# Patient Record
Sex: Female | Born: 1985 | Race: Black or African American | Hispanic: No | Smoking: Current every day smoker
Health system: Southern US, Community
[De-identification: ages and names within clinical notes are randomized; demographics above are authoritative.]

## PROBLEM LIST (undated history)

## (undated) DIAGNOSIS — N760 Acute vaginitis: Secondary | ICD-10-CM

## (undated) DIAGNOSIS — B9689 Other specified bacterial agents as the cause of diseases classified elsewhere: Secondary | ICD-10-CM

## (undated) DIAGNOSIS — Z113 Encounter for screening for infections with a predominantly sexual mode of transmission: Principal | ICD-10-CM

## (undated) DIAGNOSIS — K859 Acute pancreatitis without necrosis or infection, unspecified: Secondary | ICD-10-CM

## (undated) DIAGNOSIS — O159 Eclampsia, unspecified as to time period: Secondary | ICD-10-CM

## (undated) HISTORY — PX: NO PAST SURGERIES: SHX2092

## (undated) HISTORY — DX: Encounter for screening for infections with a predominantly sexual mode of transmission: Z11.3

## (undated) HISTORY — DX: Other specified bacterial agents as the cause of diseases classified elsewhere: N76.0

## (undated) HISTORY — DX: Other specified bacterial agents as the cause of diseases classified elsewhere: B96.89

---

## 2007-12-09 ENCOUNTER — Other Ambulatory Visit: Admission: RE | Admit: 2007-12-09 | Discharge: 2007-12-09 | Payer: Self-pay | Admitting: Obstetrics and Gynecology

## 2009-01-04 ENCOUNTER — Emergency Department (HOSPITAL_COMMUNITY): Admission: EM | Admit: 2009-01-04 | Discharge: 2009-01-05 | Payer: Self-pay | Admitting: Emergency Medicine

## 2009-01-07 ENCOUNTER — Emergency Department (HOSPITAL_COMMUNITY): Admission: EM | Admit: 2009-01-07 | Discharge: 2009-01-07 | Payer: Self-pay | Admitting: Emergency Medicine

## 2009-02-13 ENCOUNTER — Other Ambulatory Visit: Admission: RE | Admit: 2009-02-13 | Discharge: 2009-02-13 | Payer: Self-pay | Admitting: Obstetrics and Gynecology

## 2009-07-02 ENCOUNTER — Other Ambulatory Visit: Payer: Self-pay | Admitting: Emergency Medicine

## 2009-07-02 ENCOUNTER — Inpatient Hospital Stay (HOSPITAL_COMMUNITY): Admission: AD | Admit: 2009-07-02 | Discharge: 2009-07-06 | Payer: Self-pay | Admitting: Family Medicine

## 2009-07-02 ENCOUNTER — Other Ambulatory Visit: Payer: Self-pay | Admitting: Obstetrics and Gynecology

## 2009-07-02 ENCOUNTER — Ambulatory Visit: Payer: Self-pay | Admitting: Family Medicine

## 2009-07-03 ENCOUNTER — Encounter: Payer: Self-pay | Admitting: Family Medicine

## 2009-07-07 ENCOUNTER — Inpatient Hospital Stay (HOSPITAL_COMMUNITY): Admission: AD | Admit: 2009-07-07 | Discharge: 2009-07-12 | Payer: Self-pay | Admitting: Obstetrics and Gynecology

## 2009-07-07 ENCOUNTER — Ambulatory Visit: Payer: Self-pay | Admitting: Family Medicine

## 2009-07-07 ENCOUNTER — Encounter: Payer: Self-pay | Admitting: Emergency Medicine

## 2010-07-03 ENCOUNTER — Inpatient Hospital Stay (HOSPITAL_COMMUNITY)
Admission: EM | Admit: 2010-07-03 | Discharge: 2010-07-04 | Payer: Self-pay | Source: Home / Self Care | Admitting: Emergency Medicine

## 2010-07-25 ENCOUNTER — Other Ambulatory Visit: Admission: RE | Admit: 2010-07-25 | Discharge: 2010-07-25 | Payer: Self-pay | Admitting: Obstetrics and Gynecology

## 2010-12-12 LAB — DIFFERENTIAL
Basophils Absolute: 0 10*3/uL (ref 0.0–0.1)
Basophils Absolute: 0 10*3/uL (ref 0.0–0.1)
Basophils Relative: 0 % (ref 0–1)
Basophils Relative: 0 % (ref 0–1)
Eosinophils Absolute: 0 10*3/uL (ref 0.0–0.7)
Eosinophils Absolute: 0.1 10*3/uL (ref 0.0–0.7)
Eosinophils Relative: 0 % (ref 0–5)
Eosinophils Relative: 1 % (ref 0–5)
Lymphocytes Relative: 4 % — ABNORMAL LOW (ref 12–46)
Lymphocytes Relative: 5 % — ABNORMAL LOW (ref 12–46)
Lymphs Abs: 0.8 10*3/uL (ref 0.7–4.0)
Lymphs Abs: 0.9 10*3/uL (ref 0.7–4.0)
Monocytes Absolute: 0.2 10*3/uL (ref 0.1–1.0)
Monocytes Absolute: 0.3 10*3/uL (ref 0.1–1.0)
Monocytes Relative: 1 % — ABNORMAL LOW (ref 3–12)
Monocytes Relative: 2 % — ABNORMAL LOW (ref 3–12)
Neutro Abs: 15.3 10*3/uL — ABNORMAL HIGH (ref 1.7–7.7)
Neutro Abs: 18.1 10*3/uL — ABNORMAL HIGH (ref 1.7–7.7)
Neutrophils Relative %: 92 % — ABNORMAL HIGH (ref 43–77)
Neutrophils Relative %: 95 % — ABNORMAL HIGH (ref 43–77)

## 2010-12-12 LAB — CBC
HCT: 38.5 % (ref 36.0–46.0)
HCT: 38.5 % (ref 36.0–46.0)
Hemoglobin: 12.6 g/dL (ref 12.0–15.0)
Hemoglobin: 12.7 g/dL (ref 12.0–15.0)
MCH: 27.4 pg (ref 26.0–34.0)
MCH: 27.4 pg (ref 26.0–34.0)
MCHC: 32.7 g/dL (ref 30.0–36.0)
MCHC: 32.9 g/dL (ref 30.0–36.0)
MCV: 83 fL (ref 78.0–100.0)
MCV: 83.8 fL (ref 78.0–100.0)
Platelets: 203 10*3/uL (ref 150–400)
Platelets: 230 10*3/uL (ref 150–400)
RBC: 4.6 MIL/uL (ref 3.87–5.11)
RBC: 4.64 MIL/uL (ref 3.87–5.11)
RDW: 15 % (ref 11.5–15.5)
RDW: 15.9 % — ABNORMAL HIGH (ref 11.5–15.5)
WBC: 16.6 10*3/uL — ABNORMAL HIGH (ref 4.0–10.5)
WBC: 19.1 10*3/uL — ABNORMAL HIGH (ref 4.0–10.5)

## 2010-12-12 LAB — D-DIMER, QUANTITATIVE
D-Dimer, Quant: 0.26 ug/mL-FEU (ref 0.00–0.48)
D-Dimer, Quant: 0.35 ug/mL-FEU (ref 0.00–0.48)

## 2010-12-12 LAB — GLUCOSE, CAPILLARY
Glucose-Capillary: 142 mg/dL — ABNORMAL HIGH (ref 70–99)
Glucose-Capillary: 151 mg/dL — ABNORMAL HIGH (ref 70–99)
Glucose-Capillary: 158 mg/dL — ABNORMAL HIGH (ref 70–99)
Glucose-Capillary: 170 mg/dL — ABNORMAL HIGH (ref 70–99)

## 2010-12-12 LAB — BASIC METABOLIC PANEL
BUN: 10 mg/dL (ref 6–23)
BUN: 9 mg/dL (ref 6–23)
CO2: 19 mEq/L (ref 19–32)
CO2: 22 mEq/L (ref 19–32)
Calcium: 8.8 mg/dL (ref 8.4–10.5)
Calcium: 9.1 mg/dL (ref 8.4–10.5)
Chloride: 108 mEq/L (ref 96–112)
Chloride: 110 mEq/L (ref 96–112)
Creatinine, Ser: 0.75 mg/dL (ref 0.4–1.2)
Creatinine, Ser: 0.84 mg/dL (ref 0.4–1.2)
GFR calc Af Amer: >60 mL/min (ref >60)
GFR calc Af Amer: >60 mL/min (ref >60)
GFR calc non Af Amer: >60 mL/min (ref >60)
GFR calc non Af Amer: >60 mL/min (ref >60)
Glucose, Bld: 136 mg/dL — ABNORMAL HIGH (ref 70–99)
Glucose, Bld: 138 mg/dL — ABNORMAL HIGH (ref 70–99)
Potassium: 3.6 mEq/L (ref 3.5–5.1)
Potassium: 3.9 mEq/L (ref 3.5–5.1)
Sodium: 136 mEq/L (ref 135–145)
Sodium: 137 mEq/L (ref 135–145)

## 2010-12-12 LAB — URINALYSIS, ROUTINE W REFLEX MICROSCOPIC
Bilirubin Urine: NEGATIVE
Glucose, UA: 100 mg/dL — AB
Ketones, ur: NEGATIVE mg/dL
Nitrite: NEGATIVE
Protein, ur: NEGATIVE mg/dL
Specific Gravity, Urine: 1.005 (ref 1.005–1.030)
Urobilinogen, UA: 0.2 mg/dL (ref 0.0–1.0)
pH: 6 (ref 5.0–8.0)

## 2010-12-12 LAB — HEMOGLOBIN A1C
Hgb A1c MFr Bld: 5.5 % (ref <5.7)
Mean Plasma Glucose: 111 mg/dL (ref <117)

## 2010-12-12 LAB — RAPID URINE DRUG SCREEN, HOSP PERFORMED
Amphetamines: NOT DETECTED
Barbiturates: NOT DETECTED
Benzodiazepines: NOT DETECTED
Cocaine: NOT DETECTED
Opiates: NOT DETECTED
Tetrahydrocannabinol: NOT DETECTED

## 2010-12-12 LAB — CK TOTAL AND CKMB (NOT AT ARMC)
CK, MB: 0.9 ng/mL (ref 0.3–4.0)
Relative Index: INVALID (ref 0.0–2.5)
Total CK: 66 U/L (ref 7–177)

## 2010-12-12 LAB — URINE MICROSCOPIC-ADD ON

## 2010-12-12 LAB — TROPONIN I: Troponin I: <0.01 ng/mL (ref 0.00–0.06)

## 2010-12-12 LAB — TSH: TSH: 0.557 u[IU]/mL (ref 0.350–4.500)

## 2010-12-12 LAB — RAPID STREP SCREEN (MED CTR MEBANE ONLY): Streptococcus, Group A Screen (Direct): NEGATIVE

## 2011-01-02 LAB — COMPREHENSIVE METABOLIC PANEL
ALT: 30 U/L (ref 0–35)
ALT: 35 U/L (ref 0–35)
ALT: 48 U/L — ABNORMAL HIGH (ref 0–35)
ALT: 49 U/L — ABNORMAL HIGH (ref 0–35)
ALT: 63 U/L — ABNORMAL HIGH (ref 0–35)
ALT: 77 U/L — ABNORMAL HIGH (ref 0–35)
AST: 139 U/L — ABNORMAL HIGH (ref 0–37)
AST: 26 U/L (ref 0–37)
AST: 38 U/L — ABNORMAL HIGH (ref 0–37)
AST: 45 U/L — ABNORMAL HIGH (ref 0–37)
AST: 56 U/L — ABNORMAL HIGH (ref 0–37)
AST: 89 U/L — ABNORMAL HIGH (ref 0–37)
Albumin: 2.3 g/dL — ABNORMAL LOW (ref 3.5–5.2)
Albumin: 2.3 g/dL — ABNORMAL LOW (ref 3.5–5.2)
Albumin: 2.3 g/dL — ABNORMAL LOW (ref 3.5–5.2)
Albumin: 2.3 g/dL — ABNORMAL LOW (ref 3.5–5.2)
Albumin: 2.5 g/dL — ABNORMAL LOW (ref 3.5–5.2)
Alkaline Phosphatase: 67 U/L (ref 39–117)
Alkaline Phosphatase: 67 U/L (ref 39–117)
Alkaline Phosphatase: 69 U/L (ref 39–117)
Alkaline Phosphatase: 72 U/L (ref 39–117)
Alkaline Phosphatase: 72 U/L (ref 39–117)
Alkaline Phosphatase: 75 U/L (ref 39–117)
Alkaline Phosphatase: 81 U/L (ref 39–117)
Alkaline Phosphatase: 89 U/L (ref 39–117)
BUN: 12 mg/dL (ref 6–23)
BUN: 13 mg/dL (ref 6–23)
BUN: 13 mg/dL (ref 6–23)
BUN: 17 mg/dL (ref 6–23)
BUN: 17 mg/dL (ref 6–23)
BUN: 9 mg/dL (ref 6–23)
CO2: 19 mEq/L (ref 19–32)
CO2: 21 mEq/L (ref 19–32)
CO2: 24 mEq/L (ref 19–32)
CO2: 25 mEq/L (ref 19–32)
Calcium: 6.6 mg/dL — ABNORMAL LOW (ref 8.4–10.5)
Calcium: 6.8 mg/dL — ABNORMAL LOW (ref 8.4–10.5)
Calcium: 7.1 mg/dL — ABNORMAL LOW (ref 8.4–10.5)
Calcium: 7.6 mg/dL — ABNORMAL LOW (ref 8.4–10.5)
Calcium: 8.3 mg/dL — ABNORMAL LOW (ref 8.4–10.5)
Calcium: 8.7 mg/dL (ref 8.4–10.5)
Chloride: 107 mEq/L (ref 96–112)
Chloride: 109 mEq/L (ref 96–112)
Chloride: 110 mEq/L (ref 96–112)
Creatinine, Ser: 0.72 mg/dL (ref 0.4–1.2)
Creatinine, Ser: 0.78 mg/dL (ref 0.4–1.2)
Creatinine, Ser: 0.78 mg/dL (ref 0.4–1.2)
Creatinine, Ser: 0.8 mg/dL (ref 0.4–1.2)
Creatinine, Ser: 0.82 mg/dL (ref 0.4–1.2)
Creatinine, Ser: 0.82 mg/dL (ref 0.4–1.2)
GFR calc Af Amer: >60 mL/min (ref >60)
GFR calc Af Amer: >60 mL/min (ref >60)
GFR calc Af Amer: >60 mL/min (ref >60)
GFR calc Af Amer: >60 mL/min (ref >60)
GFR calc Af Amer: >60 mL/min (ref >60)
GFR calc Af Amer: >60 mL/min (ref >60)
GFR calc non Af Amer: >60 mL/min (ref >60)
GFR calc non Af Amer: >60 mL/min (ref >60)
GFR calc non Af Amer: >60 mL/min (ref >60)
GFR calc non Af Amer: >60 mL/min (ref >60)
Glucose, Bld: 101 mg/dL — ABNORMAL HIGH (ref 70–99)
Glucose, Bld: 110 mg/dL — ABNORMAL HIGH (ref 70–99)
Glucose, Bld: 78 mg/dL (ref 70–99)
Glucose, Bld: 80 mg/dL (ref 70–99)
Glucose, Bld: 81 mg/dL (ref 70–99)
Glucose, Bld: 83 mg/dL (ref 70–99)
Glucose, Bld: 89 mg/dL (ref 70–99)
Potassium: 3.7 mEq/L (ref 3.5–5.1)
Potassium: 3.9 mEq/L (ref 3.5–5.1)
Potassium: 3.9 mEq/L (ref 3.5–5.1)
Potassium: 4.2 mEq/L (ref 3.5–5.1)
Potassium: 4.3 mEq/L (ref 3.5–5.1)
Potassium: 4.4 mEq/L (ref 3.5–5.1)
Potassium: 4.5 mEq/L (ref 3.5–5.1)
Sodium: 132 mEq/L — ABNORMAL LOW (ref 135–145)
Sodium: 136 mEq/L (ref 135–145)
Sodium: 137 mEq/L (ref 135–145)
Sodium: 137 mEq/L (ref 135–145)
Sodium: 137 mEq/L (ref 135–145)
Sodium: 138 mEq/L (ref 135–145)
Total Bilirubin: 0.6 mg/dL (ref 0.3–1.2)
Total Bilirubin: 0.7 mg/dL (ref 0.3–1.2)
Total Bilirubin: 1.2 mg/dL (ref 0.3–1.2)
Total Protein: 4.3 g/dL — ABNORMAL LOW (ref 6.0–8.3)
Total Protein: 4.4 g/dL — ABNORMAL LOW (ref 6.0–8.3)
Total Protein: 4.8 g/dL — ABNORMAL LOW (ref 6.0–8.3)
Total Protein: 4.8 g/dL — ABNORMAL LOW (ref 6.0–8.3)
Total Protein: 4.8 g/dL — ABNORMAL LOW (ref 6.0–8.3)
Total Protein: 4.8 g/dL — ABNORMAL LOW (ref 6.0–8.3)
Total Protein: 5.1 g/dL — ABNORMAL LOW (ref 6.0–8.3)
Total Protein: 5.2 g/dL — ABNORMAL LOW (ref 6.0–8.3)
Total Protein: 5.2 g/dL — ABNORMAL LOW (ref 6.0–8.3)
Total Protein: 5.4 g/dL — ABNORMAL LOW (ref 6.0–8.3)
Total Protein: 5.7 g/dL — ABNORMAL LOW (ref 6.0–8.3)

## 2011-01-02 LAB — HEPATIC FUNCTION PANEL
ALT: 25 U/L (ref 0–35)
AST: 45 U/L — ABNORMAL HIGH (ref 0–37)
Alkaline Phosphatase: 84 U/L (ref 39–117)
Bilirubin, Direct: <0.1 mg/dL (ref 0.0–0.3)
Total Protein: 6.1 g/dL (ref 6.0–8.3)

## 2011-01-02 LAB — DIFFERENTIAL
Basophils Absolute: 0 10*3/uL (ref 0.0–0.1)
Basophils Absolute: 0 10*3/uL (ref 0.0–0.1)
Basophils Absolute: 0 10*3/uL (ref 0.0–0.1)
Basophils Relative: 0 % (ref 0–1)
Basophils Relative: 0 % (ref 0–1)
Eosinophils Absolute: 0 10*3/uL (ref 0.0–0.7)
Eosinophils Relative: 0 % (ref 0–5)
Eosinophils Relative: 1 % (ref 0–5)
Lymphocytes Relative: 13 % (ref 12–46)
Lymphocytes Relative: 24 % (ref 12–46)
Lymphs Abs: 1.9 10*3/uL (ref 0.7–4.0)
Monocytes Absolute: 0.5 10*3/uL (ref 0.1–1.0)
Monocytes Relative: 6 % (ref 3–12)
Neutro Abs: 5.4 10*3/uL (ref 1.7–7.7)
Neutro Abs: 8.7 10*3/uL — ABNORMAL HIGH (ref 1.7–7.7)
Neutrophils Relative %: 80 % — ABNORMAL HIGH (ref 43–77)
Neutrophils Relative %: 82 % — ABNORMAL HIGH (ref 43–77)

## 2011-01-02 LAB — BASIC METABOLIC PANEL
Calcium: 9.2 mg/dL (ref 8.4–10.5)
GFR calc Af Amer: >60 mL/min (ref >60)
GFR calc non Af Amer: >60 mL/min (ref >60)
Glucose, Bld: 88 mg/dL (ref 70–99)
Potassium: 3.5 mEq/L (ref 3.5–5.1)
Sodium: 139 mEq/L (ref 135–145)

## 2011-01-02 LAB — CBC
HCT: 25.6 % — ABNORMAL LOW (ref 36.0–46.0)
HCT: 26.7 % — ABNORMAL LOW (ref 36.0–46.0)
HCT: 27.3 % — ABNORMAL LOW (ref 36.0–46.0)
HCT: 27.4 % — ABNORMAL LOW (ref 36.0–46.0)
HCT: 30.2 % — ABNORMAL LOW (ref 36.0–46.0)
HCT: 30.6 % — ABNORMAL LOW (ref 36.0–46.0)
HCT: 31.9 % — ABNORMAL LOW (ref 36.0–46.0)
HCT: 31.9 % — ABNORMAL LOW (ref 36.0–46.0)
HCT: 32.3 % — ABNORMAL LOW (ref 36.0–46.0)
HCT: 36.1 % (ref 36.0–46.0)
Hemoglobin: 10.3 g/dL — ABNORMAL LOW (ref 12.0–15.0)
Hemoglobin: 10.6 g/dL — ABNORMAL LOW (ref 12.0–15.0)
Hemoglobin: 10.7 g/dL — ABNORMAL LOW (ref 12.0–15.0)
Hemoglobin: 10.8 g/dL — ABNORMAL LOW (ref 12.0–15.0)
Hemoglobin: 10.9 g/dL — ABNORMAL LOW (ref 12.0–15.0)
Hemoglobin: 12.5 g/dL (ref 12.0–15.0)
Hemoglobin: 8.4 g/dL — ABNORMAL LOW (ref 12.0–15.0)
Hemoglobin: 9.1 g/dL — ABNORMAL LOW (ref 12.0–15.0)
Hemoglobin: 9.3 g/dL — ABNORMAL LOW (ref 12.0–15.0)
MCHC: 32.8 g/dL (ref 30.0–36.0)
MCHC: 33.1 g/dL (ref 30.0–36.0)
MCHC: 33.3 g/dL (ref 30.0–36.0)
MCHC: 33.5 g/dL (ref 30.0–36.0)
MCHC: 33.5 g/dL (ref 30.0–36.0)
MCHC: 33.5 g/dL (ref 30.0–36.0)
MCHC: 33.9 g/dL (ref 30.0–36.0)
MCHC: 34 g/dL (ref 30.0–36.0)
MCHC: 34.2 g/dL (ref 30.0–36.0)
MCV: 91.3 fL (ref 78.0–100.0)
MCV: 91.8 fL (ref 78.0–100.0)
MCV: 92 fL (ref 78.0–100.0)
MCV: 92.2 fL (ref 78.0–100.0)
MCV: 92.3 fL (ref 78.0–100.0)
MCV: 92.6 fL (ref 78.0–100.0)
MCV: 92.7 fL (ref 78.0–100.0)
MCV: 92.9 fL (ref 78.0–100.0)
MCV: 93.1 fL (ref 78.0–100.0)
Platelets: 110 10*3/uL — ABNORMAL LOW (ref 150–400)
Platelets: 113 10*3/uL — ABNORMAL LOW (ref 150–400)
Platelets: 114 10*3/uL — ABNORMAL LOW (ref 150–400)
Platelets: 115 10*3/uL — ABNORMAL LOW (ref 150–400)
Platelets: 64 10*3/uL — ABNORMAL LOW (ref 150–400)
Platelets: 72 10*3/uL — ABNORMAL LOW (ref 150–400)
Platelets: 76 10*3/uL — ABNORMAL LOW (ref 150–400)
RBC: 2.97 MIL/uL — ABNORMAL LOW (ref 3.87–5.11)
RBC: 3.04 MIL/uL — ABNORMAL LOW (ref 3.87–5.11)
RBC: 3.5 MIL/uL — ABNORMAL LOW (ref 3.87–5.11)
RBC: 3.78 MIL/uL — ABNORMAL LOW (ref 3.87–5.11)
RDW: 13.2 % (ref 11.5–15.5)
RDW: 13.3 % (ref 11.5–15.5)
RDW: 13.6 % (ref 11.5–15.5)
RDW: 13.7 % (ref 11.5–15.5)
RDW: 13.7 % (ref 11.5–15.5)
RDW: 14.2 % (ref 11.5–15.5)
RDW: 14.3 % (ref 11.5–15.5)
RDW: 14.5 % (ref 11.5–15.5)
RDW: 14.5 % (ref 11.5–15.5)
RDW: 14.6 % (ref 11.5–15.5)
RDW: 15.7 % — ABNORMAL HIGH (ref 11.5–15.5)
WBC: 10.9 10*3/uL — ABNORMAL HIGH (ref 4.0–10.5)
WBC: 11.6 10*3/uL — ABNORMAL HIGH (ref 4.0–10.5)
WBC: 7.6 10*3/uL (ref 4.0–10.5)
WBC: 7.7 10*3/uL (ref 4.0–10.5)
WBC: 7.7 10*3/uL (ref 4.0–10.5)
WBC: 8 10*3/uL (ref 4.0–10.5)
WBC: 8 10*3/uL (ref 4.0–10.5)

## 2011-01-02 LAB — PROTEIN, URINE, 24 HOUR
Collection Interval-UPROT: 24 hours
Protein, Urine: 65 mg/dL
Urine Total Volume-UPROT: 950 mL

## 2011-01-02 LAB — URINALYSIS, ROUTINE W REFLEX MICROSCOPIC
Bilirubin Urine: NEGATIVE
Glucose, UA: NEGATIVE mg/dL
Glucose, UA: NEGATIVE mg/dL
Ketones, ur: NEGATIVE mg/dL
Leukocytes, UA: NEGATIVE
Nitrite: NEGATIVE
Protein, ur: >300 mg/dL — AB
Specific Gravity, Urine: 1.025 (ref 1.005–1.030)
Specific Gravity, Urine: 1.025 (ref 1.005–1.030)
Urobilinogen, UA: 0.2 mg/dL (ref 0.0–1.0)
pH: 6.5 (ref 5.0–8.0)
pH: 7 (ref 5.0–8.0)

## 2011-01-02 LAB — MAGNESIUM
Magnesium: 5.3 mg/dL — ABNORMAL HIGH (ref 1.5–2.5)
Magnesium: 5.5 mg/dL — ABNORMAL HIGH (ref 1.5–2.5)
Magnesium: 5.7 mg/dL — ABNORMAL HIGH (ref 1.5–2.5)

## 2011-01-02 LAB — RAPID URINE DRUG SCREEN, HOSP PERFORMED
Barbiturates: NOT DETECTED
Benzodiazepines: NOT DETECTED
Cocaine: NOT DETECTED
Cocaine: NOT DETECTED
Opiates: NOT DETECTED
Tetrahydrocannabinol: NOT DETECTED
Tetrahydrocannabinol: POSITIVE — AB

## 2011-01-02 LAB — URINE MICROSCOPIC-ADD ON

## 2011-01-02 LAB — PROTIME-INR: INR: 0.92 (ref 0.00–1.49)

## 2011-01-02 LAB — CREATININE CLEARANCE, URINE, 24 HOUR
Creatinine Clearance: 128 mL/min — ABNORMAL HIGH (ref 75–115)
Creatinine, 24H Ur: 1324 mg/d (ref 700–1800)
Creatinine: 0.72 mg/dL (ref 0.40–1.20)
Urine Total Volume-CRCL: 950 mL

## 2011-01-02 LAB — RETICULOCYTES
RBC.: 3.38 MIL/uL — ABNORMAL LOW (ref 3.87–5.11)
Retic Ct Pct: 2.4 % (ref 0.4–3.1)

## 2011-01-02 LAB — URINE CULTURE

## 2011-01-02 LAB — APTT: aPTT: 27 seconds (ref 24–37)

## 2011-01-08 LAB — WET PREP, GENITAL

## 2011-01-08 LAB — GC/CHLAMYDIA PROBE AMP, GENITAL
Chlamydia, DNA Probe: NEGATIVE
GC Probe Amp, Genital: NEGATIVE

## 2011-01-08 LAB — URINALYSIS, ROUTINE W REFLEX MICROSCOPIC
Ketones, ur: NEGATIVE mg/dL
Nitrite: NEGATIVE
pH: 7 (ref 5.0–8.0)

## 2011-01-08 LAB — URINE MICROSCOPIC-ADD ON

## 2011-01-08 LAB — PREGNANCY, URINE: Preg Test, Ur: POSITIVE

## 2011-09-30 NOTE — L&D Delivery Note (Signed)
I was present for this delivery and agree with the above resident's note.  Cord was clamped and cut immediately after delivery and infant taken to warmer for resuscitation.  NICU team paged and arrived quickly to room. LEFTWICH-KIRBY, Idil Maslanka Certified Nurse-Midwife

## 2011-09-30 NOTE — L&D Delivery Note (Signed)
Delivery Note At 3:13 PM a viable female was delivered via Vaginal, Spontaneous Delivery (Presentation: Right Occiput Anterior).  APGAR: 3, 5; weight 7 lb 5.1 oz (3320 g).   Placenta status: Intact, Spontaneous.  Cord: 3 vessels with the following complications: None.  Cord pH: 7.12, 7.05.   Anesthesia: None  Episiotomy: None Lacerations: None Suture Repair: n/a Est. Blood Loss (mL): 200cc  Mom to postpartum.  Baby to NICU for respiratory distress, decreased tone.  Holly Joseph 06/28/2012, 4:01 PM

## 2011-12-18 ENCOUNTER — Other Ambulatory Visit: Payer: Self-pay | Admitting: Obstetrics & Gynecology

## 2011-12-18 ENCOUNTER — Other Ambulatory Visit (HOSPITAL_COMMUNITY)
Admission: RE | Admit: 2011-12-18 | Discharge: 2011-12-18 | Disposition: A | Discharge status: Home / Self Care | Payer: Self-pay | Source: Ambulatory Visit | Attending: Obstetrics & Gynecology | Admitting: Obstetrics & Gynecology

## 2011-12-18 DIAGNOSIS — Z113 Encounter for screening for infections with a predominantly sexual mode of transmission: Secondary | ICD-10-CM | POA: Insufficient documentation

## 2011-12-18 DIAGNOSIS — Z01419 Encounter for gynecological examination (general) (routine) without abnormal findings: Secondary | ICD-10-CM | POA: Insufficient documentation

## 2012-06-28 ENCOUNTER — Encounter (HOSPITAL_COMMUNITY): Payer: Self-pay | Admitting: Anesthesiology

## 2012-06-28 ENCOUNTER — Inpatient Hospital Stay (HOSPITAL_COMMUNITY): Payer: Medicaid Other | Admitting: Anesthesiology

## 2012-06-28 ENCOUNTER — Encounter (HOSPITAL_COMMUNITY)
Admission: AD | Disposition: A | Discharge status: Home / Self Care | Payer: Self-pay | Source: Ambulatory Visit | Attending: Obstetrics & Gynecology

## 2012-06-28 ENCOUNTER — Inpatient Hospital Stay (HOSPITAL_COMMUNITY)
Admission: AD | Admit: 2012-06-28 | Discharge: 2012-06-30 | DRG: 767 | Disposition: A | Discharge status: Home / Self Care | Payer: Medicaid Other | Source: Ambulatory Visit | Attending: Obstetrics & Gynecology | Admitting: Obstetrics & Gynecology

## 2012-06-28 ENCOUNTER — Encounter (HOSPITAL_COMMUNITY): Payer: Self-pay

## 2012-06-28 DIAGNOSIS — Z302 Encounter for sterilization: Secondary | ICD-10-CM

## 2012-06-28 DIAGNOSIS — Z3480 Encounter for supervision of other normal pregnancy, unspecified trimester: Secondary | ICD-10-CM

## 2012-06-28 DIAGNOSIS — O0940 Supervision of pregnancy with grand multiparity, unspecified trimester: Secondary | ICD-10-CM

## 2012-06-28 HISTORY — DX: Eclampsia, unspecified as to time period: O15.9

## 2012-06-28 HISTORY — PX: TUBAL LIGATION: SHX77

## 2012-06-28 LAB — CBC
HCT: 38.3 % (ref 36.0–46.0)
MCH: 28.4 pg (ref 26.0–34.0)
MCHC: 32.6 g/dL (ref 30.0–36.0)
MCV: 87 fL (ref 78.0–100.0)
Platelets: 176 10*3/uL (ref 150–400)
RDW: 15.2 % (ref 11.5–15.5)
WBC: 7.1 10*3/uL (ref 4.0–10.5)

## 2012-06-28 LAB — OB RESULTS CONSOLE ABO/RH: RH Type: POSITIVE

## 2012-06-28 LAB — TYPE AND SCREEN
ABO/RH(D): B POS
Antibody Screen: NEGATIVE

## 2012-06-28 LAB — OB RESULTS CONSOLE HIV ANTIBODY (ROUTINE TESTING): HIV: NONREACTIVE

## 2012-06-28 LAB — OB RESULTS CONSOLE ANTIBODY SCREEN: Antibody Screen: NEGATIVE

## 2012-06-28 LAB — ABO/RH: ABO/RH(D): B POS

## 2012-06-28 SURGERY — LIGATION, FALLOPIAN TUBE, POSTPARTUM
Anesthesia: Spinal | Site: Abdomen | Laterality: Bilateral | Wound class: Clean

## 2012-06-28 MED ORDER — FENTANYL CITRATE 0.05 MG/ML IJ SOLN
INTRAMUSCULAR | Status: AC
  Filled 2012-06-28: qty 2

## 2012-06-28 MED ORDER — BENZOCAINE-MENTHOL 20-0.5 % EX AERO: 1.0000 "application " | INHALATION_SPRAY | CUTANEOUS | Status: DC | PRN

## 2012-06-28 MED ORDER — ONDANSETRON HCL 4 MG PO TABS: 4.0000 mg | ORAL_TABLET | ORAL | Status: DC | PRN

## 2012-06-28 MED ORDER — PHENYLEPHRINE 40 MCG/ML (10ML) SYRINGE FOR IV PUSH (FOR BLOOD PRESSURE SUPPORT): 80.0000 ug | PREFILLED_SYRINGE | INTRAVENOUS | Status: DC | PRN

## 2012-06-28 MED ORDER — PHENYLEPHRINE 40 MCG/ML (10ML) SYRINGE FOR IV PUSH (FOR BLOOD PRESSURE SUPPORT)
80.0000 ug | PREFILLED_SYRINGE | INTRAVENOUS | Status: DC | PRN
  Filled 2012-06-28: qty 5

## 2012-06-28 MED ORDER — EPHEDRINE 5 MG/ML INJ: 10.0000 mg | INTRAVENOUS | Status: DC | PRN

## 2012-06-28 MED ORDER — SODIUM CHLORIDE 0.9 % IR SOLN
Status: DC | PRN
  Administered 2012-06-28: 1000 mL

## 2012-06-28 MED ORDER — CEFAZOLIN SODIUM-DEXTROSE 2-3 GM-% IV SOLR
2.0000 g | Freq: Once | INTRAVENOUS | Status: AC
  Administered 2012-06-28 (×2): 2 g via INTRAVENOUS
  Filled 2012-06-28: qty 50

## 2012-06-28 MED ORDER — MIDAZOLAM HCL 2 MG/2ML IJ SOLN
INTRAMUSCULAR | Status: AC
  Filled 2012-06-28: qty 2

## 2012-06-28 MED ORDER — CITRIC ACID-SODIUM CITRATE 334-500 MG/5ML PO SOLN
30.0000 mL | ORAL | Status: DC | PRN
  Administered 2012-06-28: 30 mL via ORAL
  Filled 2012-06-28: qty 15

## 2012-06-28 MED ORDER — LACTATED RINGERS IV SOLN
INTRAVENOUS | Status: DC | PRN
  Administered 2012-06-28: 17:00:00 via INTRAVENOUS

## 2012-06-28 MED ORDER — LACTATED RINGERS IV SOLN: 500.0000 mL | INTRAVENOUS | Status: DC | PRN

## 2012-06-28 MED ORDER — OXYCODONE-ACETAMINOPHEN 5-325 MG PO TABS: 1.0000 | ORAL_TABLET | ORAL | Status: DC | PRN

## 2012-06-28 MED ORDER — FENTANYL 2.5 MCG/ML BUPIVACAINE 1/10 % EPIDURAL INFUSION (WH - ANES)
14.0000 mL/h | INTRAMUSCULAR | Status: DC
  Filled 2012-06-28: qty 60

## 2012-06-28 MED ORDER — TETANUS-DIPHTH-ACELL PERTUSSIS 5-2.5-18.5 LF-MCG/0.5 IM SUSP
0.5000 mL | Freq: Once | INTRAMUSCULAR | Status: AC
  Administered 2012-06-29: 0.5 mL via INTRAMUSCULAR
  Filled 2012-06-28: qty 0.5

## 2012-06-28 MED ORDER — IBUPROFEN 600 MG PO TABS: 600.0000 mg | ORAL_TABLET | Freq: Four times a day (QID) | ORAL | Status: DC | PRN

## 2012-06-28 MED ORDER — FENTANYL CITRATE 0.05 MG/ML IJ SOLN
100.0000 ug | INTRAMUSCULAR | Status: DC | PRN
  Administered 2012-06-28: 100 ug via INTRAVENOUS
  Filled 2012-06-28: qty 2

## 2012-06-28 MED ORDER — FENTANYL CITRATE 0.05 MG/ML IJ SOLN
INTRAMUSCULAR | Status: DC | PRN
  Administered 2012-06-28: 100 ug via INTRAVENOUS

## 2012-06-28 MED ORDER — BUPIVACAINE HCL (PF) 0.5 % IJ SOLN
INTRAMUSCULAR | Status: AC
  Filled 2012-06-28: qty 30

## 2012-06-28 MED ORDER — ACETAMINOPHEN 325 MG PO TABS: 650.0000 mg | ORAL_TABLET | ORAL | Status: DC | PRN

## 2012-06-28 MED ORDER — ONDANSETRON HCL 4 MG/2ML IJ SOLN: 4.0000 mg | INTRAMUSCULAR | Status: DC | PRN

## 2012-06-28 MED ORDER — LANOLIN HYDROUS EX OINT: TOPICAL_OINTMENT | CUTANEOUS | Status: DC | PRN

## 2012-06-28 MED ORDER — KETOROLAC TROMETHAMINE 30 MG/ML IJ SOLN
INTRAMUSCULAR | Status: DC | PRN
  Administered 2012-06-28: 30 mg via INTRAVENOUS

## 2012-06-28 MED ORDER — SENNOSIDES-DOCUSATE SODIUM 8.6-50 MG PO TABS
2.0000 | ORAL_TABLET | Freq: Every day | ORAL | Status: DC
  Administered 2012-06-28 – 2012-06-29 (×2): 2 via ORAL

## 2012-06-28 MED ORDER — OXYTOCIN BOLUS FROM INFUSION
500.0000 mL | Freq: Once | INTRAVENOUS | Status: AC
  Administered 2012-06-28: 500 mL via INTRAVENOUS
  Filled 2012-06-28: qty 500

## 2012-06-28 MED ORDER — DIPHENHYDRAMINE HCL 50 MG/ML IJ SOLN: 12.5000 mg | INTRAMUSCULAR | Status: DC | PRN

## 2012-06-28 MED ORDER — LIDOCAINE HCL (PF) 1 % IJ SOLN
30.0000 mL | INTRAMUSCULAR | Status: DC | PRN
  Filled 2012-06-28: qty 30

## 2012-06-28 MED ORDER — ONDANSETRON HCL 4 MG/2ML IJ SOLN: 4.0000 mg | Freq: Four times a day (QID) | INTRAMUSCULAR | Status: DC | PRN

## 2012-06-28 MED ORDER — FENTANYL CITRATE 0.05 MG/ML IJ SOLN: 25.0000 ug | INTRAMUSCULAR | Status: DC | PRN

## 2012-06-28 MED ORDER — ZOLPIDEM TARTRATE 5 MG PO TABS: 5.0000 mg | ORAL_TABLET | Freq: Every evening | ORAL | Status: DC | PRN

## 2012-06-28 MED ORDER — SIMETHICONE 80 MG PO CHEW: 80.0000 mg | CHEWABLE_TABLET | ORAL | Status: DC | PRN

## 2012-06-28 MED ORDER — BUPIVACAINE HCL (PF) 0.5 % IJ SOLN
INTRAMUSCULAR | Status: DC | PRN
  Administered 2012-06-28: 20 mL

## 2012-06-28 MED ORDER — LACTATED RINGERS IV SOLN
INTRAVENOUS | Status: DC
  Administered 2012-06-28: 13:00:00 via INTRAVENOUS

## 2012-06-28 MED ORDER — LACTATED RINGERS IV SOLN: 500.0000 mL | Freq: Once | INTRAVENOUS | Status: DC

## 2012-06-28 MED ORDER — PRENATAL MULTIVITAMIN CH
1.0000 | ORAL_TABLET | Freq: Every day | ORAL | Status: DC
  Administered 2012-06-28 – 2012-06-30 (×3): 1 via ORAL
  Filled 2012-06-28 (×3): qty 1

## 2012-06-28 MED ORDER — WITCH HAZEL-GLYCERIN EX PADS: 1.0000 "application " | MEDICATED_PAD | CUTANEOUS | Status: DC | PRN

## 2012-06-28 MED ORDER — DIBUCAINE 1 % RE OINT: 1.0000 "application " | TOPICAL_OINTMENT | RECTAL | Status: DC | PRN

## 2012-06-28 MED ORDER — ONDANSETRON HCL 4 MG/2ML IJ SOLN
INTRAMUSCULAR | Status: AC
  Filled 2012-06-28: qty 2

## 2012-06-28 MED ORDER — DIPHENHYDRAMINE HCL 25 MG PO CAPS: 25.0000 mg | ORAL_CAPSULE | Freq: Four times a day (QID) | ORAL | Status: DC | PRN

## 2012-06-28 MED ORDER — OXYTOCIN 40 UNITS IN LACTATED RINGERS INFUSION - SIMPLE MED
62.5000 mL/h | Freq: Once | INTRAVENOUS | Status: AC
  Administered 2012-06-28: 62.5 mL/h via INTRAVENOUS
  Filled 2012-06-28: qty 1000

## 2012-06-28 MED ORDER — FAMOTIDINE 20 MG PO TABS
20.0000 mg | ORAL_TABLET | Freq: Once | ORAL | Status: AC
  Administered 2012-06-28: 20 mg via ORAL
  Filled 2012-06-28: qty 1

## 2012-06-28 MED ORDER — EPHEDRINE 5 MG/ML INJ
10.0000 mg | INTRAVENOUS | Status: DC | PRN
  Filled 2012-06-28: qty 4

## 2012-06-28 MED ORDER — ONDANSETRON HCL 4 MG/2ML IJ SOLN
INTRAMUSCULAR | Status: DC | PRN
  Administered 2012-06-28: 4 mg via INTRAVENOUS

## 2012-06-28 MED ORDER — IBUPROFEN 600 MG PO TABS
600.0000 mg | ORAL_TABLET | Freq: Four times a day (QID) | ORAL | Status: DC
  Administered 2012-06-28 – 2012-06-30 (×6): 600 mg via ORAL
  Filled 2012-06-28 (×6): qty 1

## 2012-06-28 MED ORDER — KETOROLAC TROMETHAMINE 30 MG/ML IJ SOLN
INTRAMUSCULAR | Status: AC
  Filled 2012-06-28: qty 1

## 2012-06-28 MED ORDER — MIDAZOLAM HCL 5 MG/5ML IJ SOLN
INTRAMUSCULAR | Status: DC | PRN
  Administered 2012-06-28: 2 mg via INTRAVENOUS

## 2012-06-28 SURGICAL SUPPLY — 25 items
BENZOIN TINCTURE PRP APPL 2/3 (GAUZE/BANDAGES/DRESSINGS) IMPLANT
CHLORAPREP W/TINT 26ML (MISCELLANEOUS) ×2 IMPLANT
CLIP FILSHIE TUBAL LIGA STRL (Clip) ×2 IMPLANT
CLOTH BEACON ORANGE TIMEOUT ST (SAFETY) ×2 IMPLANT
DRSG COVADERM PLUS 2X2 (GAUZE/BANDAGES/DRESSINGS) ×2 IMPLANT
ELECT REM PT RETURN 9FT ADLT (ELECTROSURGICAL) ×2
ELECTRODE REM PT RTRN 9FT ADLT (ELECTROSURGICAL) ×1 IMPLANT
GLOVE BIO SURGEON STRL SZ7 (GLOVE) ×2 IMPLANT
GLOVE BIOGEL PI IND STRL 7.0 (GLOVE) ×2 IMPLANT
GLOVE BIOGEL PI INDICATOR 7.0 (GLOVE) ×2
GOWN PREVENTION PLUS LG XLONG (DISPOSABLE) ×4 IMPLANT
NEEDLE HYPO 25X1 1.5 SAFETY (NEEDLE) ×2 IMPLANT
NS IRRIG 1000ML POUR BTL (IV SOLUTION) ×2 IMPLANT
PACK ABDOMINAL MINOR (CUSTOM PROCEDURE TRAY) ×2 IMPLANT
PENCIL BUTTON HOLSTER BLD 10FT (ELECTRODE) ×2 IMPLANT
SPONGE LAP 4X18 X RAY DECT (DISPOSABLE) ×2 IMPLANT
STRIP CLOSURE SKIN 1/2X4 (GAUZE/BANDAGES/DRESSINGS) IMPLANT
SUT CHROMIC 2 0 TIES 18 (SUTURE) IMPLANT
SUT VIC AB 0 CT1 27 (SUTURE) ×1
SUT VIC AB 0 CT1 27XBRD ANBCTR (SUTURE) ×1 IMPLANT
SUT VICRYL 4-0 PS2 18IN ABS (SUTURE) ×2 IMPLANT
SYR CONTROL 10ML LL (SYRINGE) ×2 IMPLANT
TOWEL OR 17X24 6PK STRL BLUE (TOWEL DISPOSABLE) ×4 IMPLANT
TRAY FOLEY CATH 14FR (SET/KITS/TRAYS/PACK) ×2 IMPLANT
WATER STERILE IRR 1000ML POUR (IV SOLUTION) IMPLANT

## 2012-06-28 NOTE — Anesthesia Preprocedure Evaluation (Deleted)
Anesthesia Evaluation  Patient identified by MRN, date of birth, ID band Patient awake    Reviewed: Allergy & Precautions, H&P , Patient's Chart, lab work & pertinent test results  Airway Mallampati: II  TM Distance: >3 FB Neck ROM: full    Dental  (+) Teeth Intact   Pulmonary    breath sounds clear to auscultation       Cardiovascular  Rhythm:regular Rate:Normal     Neuro/Psych    GI/Hepatic   Endo/Other  Morbid obesity  Renal/GU      Musculoskeletal   Abdominal   Peds  Hematology   Anesthesia Other Findings       Reproductive/Obstetrics (+) Pregnancy                            Anesthesia Physical Anesthesia Plan  ASA: II  Anesthesia Plan: Epidural   Post-op Pain Management:    Induction:   Airway Management Planned:   Additional Equipment:   Intra-op Plan:   Post-operative Plan:   Informed Consent: I have reviewed the patients History and Physical, chart, labs and discussed the procedure including the risks, benefits and alternatives for the proposed anesthesia with the patient or authorized representative who has indicated his/her understanding and acceptance.   Dental Advisory Given  Plan Discussed with:   Anesthesia Plan Comments: (Labs checked- platelets confirmed with RN in room. Fetal heart tracing, per RN, reported to be stable enough for sitting procedure. Discussed epidural, and patient consents to the procedure:  included risk of possible headache,backache, failed block, allergic reaction, and nerve injury. This patient was asked if she had any questions or concerns before the procedure started.)        Anesthesia Quick Evaluation  

## 2012-06-28 NOTE — Progress Notes (Signed)
Attending Progress Note  26 y.o. [id number] s/p SVD desires postpartum permanent sterilization.  Other reversible forms of contraception were discussed with patient; she declines all other modalities. Risks of procedure discussed with patient including but not limited to: risk of regret, permanence of method, bleeding, infection, injury to surrounding organs and need for additional procedures.  Failure risk of 0.5-1% with increased risk of ectopic gestation if pregnancy occurs was also discussed with patient.  Patient verbalized understanding of these risks and wants to proceed with sterilization.  Written informed consent obtained.  OR and Anesthesia notified. To OR when ready.  Jaynie Collins, MD, FACOG Attending Obstetrician & Gynecologist Faculty Practice, [facility]

## 2012-06-28 NOTE — Addendum Note (Signed)
Addendum  created 06/28/12 2102 by Orlie Pollen, CRNA   Modules edited:Anesthesia Flowsheet

## 2012-06-28 NOTE — H&P (Signed)
Holly Joseph is a 26 y.o. female presenting for SOOL.  Pt will contractions starting this morning. No LOF or bleeding. +FM. Rates contractions as increasing in intensity.  Has h/o eclampsia with 1st pregnancy (had seizure and was induced at 32 weeks- seizure was 1st indication of issue with BPs); pressures have been normal this pregnancy.   Maternal Medical History:  Reason for admission: Reason for admission: contractions.  Reason for Admission:   nauseaContractions: Onset was 3-5 hours ago.   Frequency: regular.   Perceived severity is moderate.    Fetal activity: Perceived fetal activity is normal.    Prenatal complications: No bleeding, hypertension, pre-eclampsia or preterm labor.   Prenatal Complications - Diabetes: none.    OB History    Grav Para Term Preterm Abortions TAB SAB Ect Mult Living   2 1  1      1      Past Medical History  Diagnosis Date  . Eclamptic seizure    Past Surgical History  Procedure Date  . No past surgeries    Family History: family history is negative for Other. Social History:  reports that she has been smoking Cigarettes.  She has been smoking about .25 packs per day. She has never used smokeless tobacco. She reports that she uses illicit drugs (Marijuana). She reports that she does not drink alcohol.   Prenatal Transfer Tool  Maternal Diabetes: No Genetic Screening: Normal Maternal Ultrasounds/Referrals: Normal; had marginal previa that resolved at 28 weeks Fetal Ultrasounds or other Referrals:  None Maternal Substance Abuse:  No Significant Maternal Medications:  None Significant Maternal Lab Results:  Lab values include: Group B Strep negative Other Comments:  None  Review of Systems  Constitutional: Negative for fever.  Cardiovascular: Negative for chest pain.  Gastrointestinal: Positive for abdominal pain. Negative for nausea and vomiting.  Neurological: Negative for dizziness and headaches.    Dilation: 7 Effacement (%):  90 Station: -1 Exam by:: Dr. Fara Boros Blood pressure 123/69, pulse 60, temperature 98.8 F (37.1 C), temperature source Oral, resp. rate 16, height 5\' 3"  (1.6 m), weight 95.8 kg (211 lb 3.2 oz), SpO2 100.00%. Maternal Exam:  Uterine Assessment: Contraction strength is moderate.  Contraction frequency is regular.   Abdomen: Patient reports no abdominal tenderness. Introitus: Normal vagina.  Ferning test: not done.  Amniotic fluid character: not assessed.  Pelvis: adequate for delivery.   Cervix: Cervix evaluated by digital exam.     Fetal Exam Fetal Monitor Review: Baseline rate: 130.  Variability: moderate (6-25 bpm).   Pattern: accelerations present and no decelerations.    Fetal State Assessment: Category I - tracings are normal.     Physical Exam  Constitutional: She is oriented to person, place, and time. She appears well-developed and well-nourished.  Cardiovascular: Normal rate and regular rhythm.   No murmur heard. Respiratory: Effort normal and breath sounds normal. No respiratory distress.  GI:       gravid  Musculoskeletal: She exhibits no edema.  Neurological: She is oriented to person, place, and time.  Skin: Skin is warm and dry.    Prenatal labs: ABO, Rh:  B+ Antibody:  neg Rubella:  imm RPR:   NR HBsAg:   neg HIV:   neg GBS: Negative (09/15 0000)   Assessment/Plan: 26 yo G2P0101 @ 39.5 weeks followed at [facility] presents with SOOL -Admit to L&D -GBS negative -Pt with h/o eclampsia with 1st pregnancy, will monitor BPs but have been normal during current pregnancy -Epidural upon request -  Category 1 fetal tracing   Breast BTL (papers signed 05/13/12 with FT)  Holly Joseph 06/28/2012, 12:42 PM

## 2012-06-28 NOTE — Progress Notes (Signed)
Holly Joseph is a 26 y.o. G2P0101 at [redacted]w[redacted]d admitted for active labor  Subjective: Waiting on labs to come back so she can have epidural placed; ctx are getting stronger  Objective: BP 120/65  Pulse 59  Temp 97.7 F (36.5 C) (Oral)  Resp 18  Ht 5\' 3"  (1.6 m)  Wt 95.8 kg (211 lb 3.2 oz)  BMI 37.41 kg/m2  SpO2 100%      FHT:  FHR: 120 bpm, variability: moderate,  accelerations:  Present,  decelerations:  Present early's UC:   regular, every 2 minutes SVE:   Dilation: 7 Effacement (%): 90 Station: -1 Exam by:: Dr. Fara Boros  Labs: Lab Results  Component Value Date   WBC 7.1 06/28/2012   HGB 12.5 06/28/2012   HCT 38.3 06/28/2012   MCV 87.0 06/28/2012   PLT 176 06/28/2012    Assessment / Plan: Spontaneous labor, progressing normally  Labor: Progressing normally Preeclampsia:  n/a Fetal Wellbeing:  Category I Pain Control:  fentantyl while waiting on labs so that epidural can be placed I/D:  n/a Anticipated MOD:  NSVD  Holly Joseph 06/28/2012, 2:23 PM

## 2012-06-28 NOTE — Anesthesia Procedure Notes (Signed)
Spinal  Additional Notes Spinal Dosage in OR  Bupivicaine ml       1.3 PFMS04   mcg        150 Fentanyl mcg            25    

## 2012-06-28 NOTE — OR Nursing (Signed)
Filshie clips applied to right and left fallopian tubes by Dr. Marquis Lunch. Anyanwu on 06/28/2012. Lot [id number]. Expires-2016-02. Manufacture-[employer].

## 2012-06-28 NOTE — Anesthesia Postprocedure Evaluation (Signed)
  Anesthesia Post-op Note  Patient: Holly Joseph  Procedure(s) Performed: Procedure(s) (LRB) with comments: POST PARTUM TUBAL LIGATION (Bilateral) - Post partum tubal ligation with filshie clips  Patient Location: PACU  Anesthesia Type: Epidural  Level of Consciousness: awake, alert  and oriented  Airway and Oxygen Therapy: Patient Spontanous Breathing  Post-op Pain: none  Post-op Assessment: Post-op Vital signs reviewed, Patient's Cardiovascular Status Stable, Respiratory Function Stable, Patent Airway, No signs of Nausea or vomiting and Pain level controlled  Post-op Vital Signs: Reviewed and stable  Complications: No apparent anesthesia complications

## 2012-06-28 NOTE — Anesthesia Preprocedure Evaluation (Signed)
Anesthesia Evaluation  Patient identified by MRN, date of birth, ID band Patient awake    Reviewed: Allergy & Precautions, H&P , Patient's Chart, lab work & pertinent test results  Airway Mallampati: II TM Distance: >3 FB Neck ROM: full    Dental No notable dental hx.    Pulmonary  breath sounds clear to auscultation  Pulmonary exam normal       Cardiovascular Exercise Tolerance: Good Rhythm:regular Rate:Normal     Neuro/Psych    GI/Hepatic   Endo/Other    Renal/GU      Musculoskeletal   Abdominal   Peds  Hematology   Anesthesia Other Findings   Reproductive/Obstetrics                           Anesthesia Physical Anesthesia Plan  ASA: III  Anesthesia Plan: Spinal   Post-op Pain Management:    Induction:   Airway Management Planned:   Additional Equipment:   Intra-op Plan:   Post-operative Plan:   Informed Consent: I have reviewed the patients History and Physical, chart, labs and discussed the procedure including the risks, benefits and alternatives for the proposed anesthesia with the patient or authorized representative who has indicated his/her understanding and acceptance.   Dental Advisory Given  Plan Discussed with: CRNA  Anesthesia Plan Comments: (Lab work confirmed with CRNA in room. Platelets okay. Discussed spinal anesthetic, and patient consents to the procedure:  included risk of possible headache,backache, failed block, allergic reaction, and nerve injury. This patient was asked if she had any questions or concerns before the procedure started. )        Anesthesia Quick Evaluation  

## 2012-06-28 NOTE — Op Note (Signed)
Holly Joseph 06/28/2012  PREOPERATIVE DIAGNOSIS:  Multiparity, undesired fertility  POSTOPERATIVE DIAGNOSIS:  Multiparity, undesired fertility  PROCEDURE:  Postpartum Bilateral Tubal Sterilization using Filshie Clips   ANESTHESIA:  Epidural and local analgesia using 0.5% Marcaine  COMPLICATIONS:  None immediate.  ESTIMATED BLOOD LOSS: 10 ml.  FLUIDS: 700 ml LR.  URINE OUTPUT:  800 ml of clear urine.  INDICATIONS: 26 y.o. [id number] with undesired fertility,status post vaginal delivery, desires permanent sterilization.  Other reversible forms of contraception were discussed with patient; she declines all other modalities. Risks of procedure discussed with patient including but not limited to: risk of regret, permanence of method, bleeding, infection, injury to surrounding organs and need for additional procedures.  Failure risk of 0.5-1% with increased risk of ectopic gestation if pregnancy occurs was also discussed with patient.     FINDINGS:  Normal uterus, tubes, and ovaries.  PROCEDURE DETAILS: The patient was taken to the operating room where her epidural anesthesia was dosed up to surgical level and found to be adequate.  She was then placed in the dorsal supine position and prepped and draped in sterile fashion.  After an adequate timeout was performed, attention was turned to the patient's abdomen where a small transverse skin incision was made under the umbilical fold. The incision was taken down to the layer of fascia using the scalpel, and fascia was incised, and extended bilaterally using Mayo scissors. The peritoneum was entered in a sharp fashion. Attention was then turned to the patient's uterus, and left fallopian tube was identified and followed out to the fimbriated end.  A Filshie clip was placed on the left fallopian tube about 3 cm from the cornual attachment, with care given to incorporate the underlying mesosalpinx.  A similar process was carried out on the right side  allowing for bilateral tubal sterilization.  Good hemostasis was noted overall.  Local analgesia was injected into both Filshie application sites.The instruments were then removed from the patient's abdomen and the fascial incision was repaired with 0 Vicryl, and the skin was closed with a 4-0 Vicryl subcuticular stitch. The patient tolerated the procedure well.  Instrument, sponge, and needle counts were correct times two.  The patient was then taken to the recovery room awake and in stable condition.

## 2012-06-28 NOTE — Transfer of Care (Signed)
Immediate Anesthesia Transfer of Care Note  Patient: Holly Joseph  Procedure(s) Performed: Procedure(s) (LRB) with comments: POST PARTUM TUBAL LIGATION (Bilateral) - Post partum tubal ligation with filshie clips  Patient Location: PACU  Anesthesia Type: Spinal  Level of Consciousness: awake  Airway & Oxygen Therapy: Patient Spontanous Breathing  Post-op Assessment: Report given to PACU RN  Post vital signs: Reviewed and stable  Complications: No apparent anesthesia complications

## 2012-06-28 NOTE — MAU Note (Signed)
Patient states she is having contractions every 15-20 minutes. Denies leaking or bleeding and reports good fetal movement.

## 2012-06-28 NOTE — H&P (Signed)
Attestation of Attending Supervision of Resident: Evaluation and management procedures were performed by the Family Medicine Resident under my supervision.  I have seen and examined the patient, reviewed the resident's note and chart, and I agree with the management and plan.  UGONNA  ANYANWU, MD, FACOG Attending Obstetrician & Gynecologist Faculty Practice, [facility] 

## 2012-06-29 ENCOUNTER — Encounter (HOSPITAL_COMMUNITY): Payer: Self-pay | Admitting: Obstetrics & Gynecology

## 2012-06-29 LAB — RAPID URINE DRUG SCREEN, HOSP PERFORMED
Amphetamines: NOT DETECTED
Barbiturates: NOT DETECTED
Benzodiazepines: POSITIVE — AB
Tetrahydrocannabinol: NOT DETECTED

## 2012-06-29 MED ORDER — INFLUENZA VIRUS VACC SPLIT PF IM SUSP
0.5000 mL | INTRAMUSCULAR | Status: AC
  Administered 2012-06-29: 0.5 mL via INTRAMUSCULAR
  Filled 2012-06-29: qty 0.5

## 2012-06-29 MED ORDER — PNEUMOCOCCAL VAC POLYVALENT 25 MCG/0.5ML IJ INJ
0.5000 mL | INJECTION | INTRAMUSCULAR | Status: AC
  Administered 2012-06-30: 0.5 mL via INTRAMUSCULAR
  Filled 2012-06-29: qty 0.5

## 2012-06-29 MED ORDER — PNEUMOCOCCAL VAC POLYVALENT 25 MCG/0.5ML IJ INJ: 0.5000 mL | INJECTION | INTRAMUSCULAR | Status: DC

## 2012-06-29 NOTE — Addendum Note (Signed)
Addendum  created 06/29/12 2130 by Suella Grove, CRNA   Modules edited:Notes Section

## 2012-06-29 NOTE — Progress Notes (Signed)
I have seen this patient and agree with the above resident's note.  LEFTWICH-KIRBY, LISA Certified Nurse-Midwife 

## 2012-06-29 NOTE — Anesthesia Postprocedure Evaluation (Signed)
  Anesthesia Post-op Note  Patient: Holly Joseph  Procedure(s) Performed: Procedure(s) (LRB) with comments: POST PARTUM TUBAL LIGATION (Bilateral) - Post partum tubal ligation with filshie clips  Patient Location: PACU and Women's Unit  Anesthesia Type: Spinal  Level of Consciousness: awake  Airway and Oxygen Therapy: Patient Spontanous Breathing  Post-op Pain: none  Post-op Assessment: Patient's Cardiovascular Status Stable, Respiratory Function Stable, Patent Airway, No signs of Nausea or vomiting, Adequate PO intake, Pain level controlled, No headache, No backache, No residual numbness and No residual motor weakness  Post-op Vital Signs: Reviewed and stable  Complications: No apparent anesthesia complications

## 2012-06-29 NOTE — Clinical Social Work Maternal (Signed)
Clinical Social Work Department PSYCHOSOCIAL ASSESSMENT - MATERNAL/CHILD 06/29/2012  Patient:  Holly Joseph,Holly Joseph  Account Number:  [id number]  Admit Date:  06/28/2012  Childs Name:   Natalya Hilaire    Clinical Social Worker:  Aidaly Cordner, LCSW   Date/Time:  06/29/2012 01:00 PM  Date Referred:  06/29/2012   Referral source  NICU     Referred reason  NICU   Other referral source:    I:  FAMILY / HOME ENVIRONMENT Child's legal guardian:  PARENT  Guardian - Name Guardian - Age Guardian - Address  Holly Joseph 26 [address] [place], [place] [address]  Holly Mckimmy Sr.  same   Other household support members/support persons Name Relationship DOB  Holly Bathe Jr. BROTHER 07/02/09   Other support:   Good supports.  MGM in room with parents.  Other visitor stepped out when SW came to speak to parents.    II  PSYCHOSOCIAL DATA Information Source:  Family Interview  Financial and Community Resources Employment:   FOB works at [employer] and [employer]  MOB works for [employer], but she is not sure if she will be returning to work.   Financial resources:   If Medicaid - County:    School / Grade:   Maternity Care Coordinator / Child Services Coordination / Early Interventions:  Cultural issues impacting care:   None identified    III  STRENGTHS Strengths  Compliance with medical plan  Adequate Resources  Other - See comment  Supportive family/friends  Understanding of illness   Strength comment:  Pediatric followup will be at the [facility]   IV  RISK FACTORS AND CURRENT PROBLEMS Current Problem:  YES   Risk Factor & Current Problem Patient Issue Family Issue Risk Factor / Current Problem Comment  Substance Abuse N N Both parents use marijuana    V  SOCIAL WORK ASSESSMENT SW met with parents in MOB's third floor room/318 to introduce myself, complete assessment and evaluate how they are coping with baby's admission to NICU.  FOB was not  present initially, but came in during first few minutes of the conversation.  MGM and a female visitor were with MOB and the female visitor left, but MGM stayed.  MOB stated that we could talk about anything in front of her mother and husband.  MOB states that she and baby are doing well today.  They report that they have a 24 year old at home who was born at 32 weeks and spent about a month in the NICU. They state that they had a good experience with that hospitalization and although they were nervous when this baby was admitted to the NICU, they knew she would be well cared for.  They state no questions about her admission and seem to have a good understanding.  SW asked MOB about PPD. She didn't repond at first, but FOB nodded that she had symptoms.  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  SW normalized this and told MOB how common it is.  We reviewed signs and symptoms and MOB states she feels comfortable talking with her doctor if symptoms arise.  SW gave Feelings After Birth handout for reference and support options if needed.  Parents report having good supports and have begun getting supplies for baby.  They informed SW that they have a bassinett for her already.  SW asked them to continue preparing, but that if they are in need of assistance, to please let SW know.  They agreed.    SW inquired about MOB's history of marijuana use.  She was quiet, but open about it.  She reports she does not think it's a problem for her and that she did not smoke during the beginning of her pregnancy, but admits to smoking near the end.  She reports last use on Saturday (06/26/12).  SW asked FOB if he smokes and he says yes.  He states he smokes as a stress reliever.  SW was understanding, but asked him to think about more positive coping mechanisms. MOB states she does not know why she smokes.  Both state they do not smoke around their child.  SW explained drug screen policy and parents were understanding.  They are expecting  a positive screen since MOB's last use was a few days ago.  Baby's UDS, however, is negative.  SW asked if they have ever had any involvement with CPS and they say no.  MOB informed SW that she thinks her son was born positive for marijuana, but reports that CPS was not involved.  They deny any other drug use.  SW explained support services offered by NICU SW and gave contact information.      VI SOCIAL WORK PLAN Social Work Plan  Psychosocial Support/Ongoing Assessment of Needs   Type of pt/family education:   PPD signs and symptoms  hospital drug screen policy   If child protective services report - county:   If child protective services report - date:   Information/referral to community resources comment:   PPD support information if needed.   Other social work plan:   SW will monitor meconium drug screen results and make report if necessary.    

## 2012-06-29 NOTE — Progress Notes (Signed)
UR Chart review completed.  

## 2012-06-29 NOTE — Progress Notes (Signed)
Post Partum Day #1 Subjective: no complaints, up ad lib, voiding, tolerating PO and + flatus  Objective: Blood pressure 113/64, pulse 64, temperature 98.3 F (36.8 C), temperature source Oral, resp. rate 18, height 5\' 3"  (1.6 m), weight 95.8 kg (211 lb 3.2 oz), SpO2 98.00%, unknown if currently breastfeeding.  Physical Exam:  General: alert and cooperative Lochia: appropriate Uterine Fundus: firm, at umbilicus Incision: Umbilical incision intact. Dressing clean and dry. DVT Evaluation: No evidence of DVT seen on physical exam. Negative Homan's sign. No cords or calf tenderness. No significant calf/ankle edema.   Basename 06/28/12 1313  HGB 12.5  HCT 38.3    Assessment/Plan: Breastfeeding and Contraception BTL   LOS: 1 day   Briza Bark 06/29/2012, 7:26 AM

## 2012-06-30 MED ORDER — BENZOCAINE-MENTHOL 20-0.5 % EX AERO: 1.0000 "application " | INHALATION_SPRAY | CUTANEOUS | Status: DC | PRN

## 2012-06-30 MED ORDER — OXYCODONE-ACETAMINOPHEN 5-325 MG PO TABS
1.0000 | ORAL_TABLET | ORAL | Status: DC | PRN
Start: 2012-06-30 — End: 2013-04-09

## 2012-06-30 MED ORDER — IBUPROFEN 600 MG PO TABS: 600.0000 mg | ORAL_TABLET | Freq: Four times a day (QID) | ORAL | Status: DC

## 2012-06-30 MED ORDER — LANOLIN HYDROUS EX OINT: 1.0000 "application " | TOPICAL_OINTMENT | CUTANEOUS | Status: DC | PRN

## 2012-06-30 NOTE — Progress Notes (Signed)
I have seen and examined this patient and I agree with the above. Infant improving in NICU, per pt. Cam Hai 8:11 AM 06/30/2012

## 2012-06-30 NOTE — Progress Notes (Signed)
Post Partum Day #2 Subjective: no complaints, up ad lib, voiding, tolerating PO, + flatus and no BM. Ambulating well.   Objective: Blood pressure 107/67, pulse 76, temperature 98.3 F (36.8 C), temperature source Oral, resp. rate 18, height 5\' 3"  (1.6 m), weight 95.8 kg (211 lb 3.2 oz), SpO2 97.00%, unknown if currently breastfeeding.  Physical Exam:  General: alert, cooperative and laying in bed, awake. Lochia: appropriate Uterine Fundus: firm, below the umbilicus Incision: healing well, no significant drainage, BTL incision at umbilius DVT Evaluation: No evidence of DVT seen on physical exam. Negative Homan's sign. No cords or calf tenderness. No significant calf/ankle edema.   Basename 06/28/12 1313  HGB 12.5  HCT 38.3   Drugs of Abuse     Component Value Date/Time   LABOPIA NONE DETECTED 06/28/2012 1945   COCAINSCRNUR NONE DETECTED 06/28/2012 1945   LABBENZ POSITIVE* 06/28/2012 1945   AMPHETMU NONE DETECTED 06/28/2012 1945   THCU NONE DETECTED 06/28/2012 1945   LABBARB NONE DETECTED 06/28/2012 1945    Assessment/Plan: Discharge home, Breastfeeding and Contraception BTL.   LOS: 2 days   Kie Calvin 06/30/2012, 7:30 AM

## 2012-06-30 NOTE — Progress Notes (Signed)
06/30/12 1115  Clinical Encounter Type  Visited With Patient and family together (husband Gardiner Barefoot)  Visit Type Initial;Spiritual support;Social support  Spiritual Encounters  Spiritual Needs Emotional  Stress Factors  Patient Stress Factors (facing discharge while baby still in NICU)    Met Varina and husband Gardiner Barefoot in NICU.  They were very pleased to learn of chaplain availability.  Ovella was feeling the tug of facing discharge without bringing her baby home, and Gardiner Barefoot was anxious about NICU stay and baby's needs, though he "know[s] that this is the best place for her."  He named that he's trying to stay strong for wife and baby, but that he's been worried and stressed.    We talked together about their feelings and hopes, as well as their balancing care at home for their son, whose third birthday is this weekend.  Provided opportunity for them to tell and process their story, and plan to follow up for further support and encouragement.  [address] [place], [place] [phone]

## 2012-06-30 NOTE — Discharge Summary (Signed)
Obstetric Discharge Summary Reason for Admission: onset of labor Prenatal Procedures: ultrasound Intrapartum Procedures: spontaneous vaginal delivery Postpartum Procedures: P.P. tubal ligation Complications-Operative and Postpartum: none Hemoglobin  Date Value Range Status  06/28/2012 12.5  12.0 - 15.0 g/dL Final     HCT  Date Value Range Status  06/28/2012 38.3  36.0 - 46.0 % Final    Physical Exam:  General: alert, cooperative and ambulating well. Lochia: appropriate Uterine Fundus: firm, below the umbilicus Incision: BTL incision in the umbilicus DVT Evaluation: No evidence of DVT seen on physical exam. Negative Homan's sign. No cords or calf tenderness.  Discharge Diagnoses: Term Pregnancy-delivered  Discharge Information: Date: 06/30/2012 Activity: no sexual intercourse for 4-6 weeks Diet: regular Medications: Ibuprofen and Percocet Condition: stable Instructions: If any complications, such as fever above 100.4, increased bleeding that is uncontrolled, or large clots (gold ball size) please call your OB immediately. Discharge to: Home   Newborn Data: Live born female  Birth Weight: 7 lb 5.1 oz (3320 g) APGAR: 3, 5    Kuneff, Renee 06/30/2012, 11:07 AM

## 2012-08-03 DIAGNOSIS — Z302 Encounter for sterilization: Secondary | ICD-10-CM

## 2013-04-09 ENCOUNTER — Emergency Department (HOSPITAL_COMMUNITY)
Admission: EM | Admit: 2013-04-09 | Discharge: 2013-04-09 | Disposition: A | Discharge status: Home / Self Care | Payer: Medicaid Other | Attending: Emergency Medicine | Admitting: Emergency Medicine

## 2013-04-09 ENCOUNTER — Encounter (HOSPITAL_COMMUNITY): Payer: Self-pay

## 2013-04-09 ENCOUNTER — Emergency Department (HOSPITAL_COMMUNITY): Payer: No Typology Code available for payment source

## 2013-04-09 DIAGNOSIS — Z91040 Latex allergy status: Secondary | ICD-10-CM | POA: Insufficient documentation

## 2013-04-09 DIAGNOSIS — F329 Major depressive disorder, single episode, unspecified: Secondary | ICD-10-CM | POA: Insufficient documentation

## 2013-04-09 DIAGNOSIS — Y9389 Activity, other specified: Secondary | ICD-10-CM | POA: Insufficient documentation

## 2013-04-09 DIAGNOSIS — Z3202 Encounter for pregnancy test, result negative: Secondary | ICD-10-CM | POA: Insufficient documentation

## 2013-04-09 DIAGNOSIS — F3289 Other specified depressive episodes: Secondary | ICD-10-CM | POA: Insufficient documentation

## 2013-04-09 DIAGNOSIS — S39012A Strain of muscle, fascia and tendon of lower back, initial encounter: Secondary | ICD-10-CM

## 2013-04-09 DIAGNOSIS — F39 Unspecified mood [affective] disorder: Secondary | ICD-10-CM | POA: Insufficient documentation

## 2013-04-09 DIAGNOSIS — S335XXA Sprain of ligaments of lumbar spine, initial encounter: Secondary | ICD-10-CM | POA: Insufficient documentation

## 2013-04-09 DIAGNOSIS — F17200 Nicotine dependence, unspecified, uncomplicated: Secondary | ICD-10-CM | POA: Insufficient documentation

## 2013-04-09 DIAGNOSIS — Z8669 Personal history of other diseases of the nervous system and sense organs: Secondary | ICD-10-CM | POA: Insufficient documentation

## 2013-04-09 DIAGNOSIS — Y92410 Unspecified street and highway as the place of occurrence of the external cause: Secondary | ICD-10-CM | POA: Insufficient documentation

## 2013-04-09 MED ORDER — CYCLOBENZAPRINE HCL 5 MG PO TABS: 5.0000 mg | ORAL_TABLET | Freq: Three times a day (TID) | ORAL | Status: DC | PRN

## 2013-04-09 MED ORDER — IBUPROFEN 600 MG PO TABS: 600.0000 mg | ORAL_TABLET | Freq: Four times a day (QID) | ORAL | Status: DC | PRN

## 2013-04-09 NOTE — ED Provider Notes (Signed)
Medical screening examination/treatment/procedure(s) were conducted as a shared visit with non-physician practitioner(s) and myself.  I personally evaluated the patient during the encounter  Pt stable, denies SI and does not have thoughts of hurting family.  Stable for d/c home  Joya Gaskins, MD 04/09/13 1530
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
## 2013-12-05 ENCOUNTER — Ambulatory Visit (INDEPENDENT_AMBULATORY_CARE_PROVIDER_SITE_OTHER): Payer: Self-pay | Admitting: Adult Health

## 2013-12-05 ENCOUNTER — Encounter: Payer: Self-pay | Admitting: Adult Health

## 2013-12-05 VITALS — BP 120/78 | Ht 64.0 in | Wt 152.0 lb

## 2013-12-05 DIAGNOSIS — Z113 Encounter for screening for infections with a predominantly sexual mode of transmission: Secondary | ICD-10-CM

## 2013-12-05 HISTORY — DX: Encounter for screening for infections with a predominantly sexual mode of transmission: Z11.3

## 2013-12-05 LAB — RPR

## 2013-12-05 LAB — HIV ANTIBODY (ROUTINE TESTING W REFLEX): HIV: NONREACTIVE

## 2013-12-05 NOTE — Progress Notes (Signed)
Subjective:     Patient ID: Holly Joseph, female   DOB: 02/10/1986, 28 y.o.   MRN: [id number]  HPI Holly Joseph is a 28 year old black female in complaining of vaginal discharge,was seen at health dept an treated for BV recently.She is itchy at times.She also complains of 3 red bumps that itch,that seem to come at same spot, has no known herpes.Has a different partner.  Review of Systems See HPI Reviewed past medical,surgical, social and family history. Reviewed medications and allergies.     Objective:   Physical Exam BP 120/78  Ht 5\' 4"  (1.626 m)  Wt 152 lb (68.947 kg)  BMI 26.08 kg/m2  LMP 11/08/2013  Breastfeeding? No   Skin warm and dry.Pelvic: external genitalia is normal in appearance, no lesions, vagina: scant discharge without odor, cervix:smooth and bulbous, uterus: normal size, shape and contour, non tender, no masses felt, adnexa: no masses or tenderness noted. GC/CHL obtained.   Assessment:    STD screening    Plan:     Check GC/CHL, HIV,RPR and HSV 2 Will follow up by phone or my chart this week Review handout on herpes   Use condoms

## 2013-12-05 NOTE — Patient Instructions (Signed)
Herpes Simplex Herpes simplex is generally classified as Type 1 or Type 2. Type 1 is generally the type that is responsible for cold sores. Type 2 is generally associated with sexually transmitted diseases. We now know that most of the thoughts on these viruses are inaccurate. We find that HSV1 is also present genitally and HSV2 can be present orally, but this will vary in different locations of the world. Herpes simplex is usually detected by doing a culture. Blood tests are also available for this virus; however, the accuracy is often not as good.  PREPARATION FOR TEST No preparation or fasting is necessary. NORMAL FINDINGS  No virus present  No HSV antigens or antibodies present Ranges for normal findings may vary among different laboratories and hospitals. You should always check with your doctor after having lab work or other tests done to discuss the meaning of your test results and whether your values are considered within normal limits. MEANING OF TEST  Your caregiver will go over the test results with you and discuss the importance and meaning of your results, as well as treatment options and the need for additional tests if necessary. OBTAINING THE TEST RESULTS  It is your responsibility to obtain your test results. Ask the lab or department performing the test when and how you will get your results. Document Released: 10/18/2004 Document Revised: 12/08/2011 Document Reviewed: 08/26/2008 [facility] Patient Information 2014 [place], [place]. Follow up by phone or my chart this week

## 2013-12-06 LAB — GC/CHLAMYDIA PROBE AMP
CT Probe RNA: NEGATIVE
GC Probe RNA: NEGATIVE

## 2013-12-06 LAB — HSV 2 ANTIBODY, IGG: HSV 2 GLYCOPROTEIN G AB, IGG: 2.16 IV — AB

## 2013-12-08 ENCOUNTER — Telehealth: Payer: Self-pay | Admitting: Adult Health

## 2013-12-08 NOTE — Telephone Encounter (Signed)
Pt aware +HSV 2 but low number at 2.16 will re check in 3 months

## 2014-03-10 ENCOUNTER — Other Ambulatory Visit: Payer: Self-pay

## 2014-07-31 ENCOUNTER — Encounter: Payer: Self-pay | Admitting: Adult Health

## 2014-11-30 ENCOUNTER — Emergency Department (HOSPITAL_COMMUNITY)
Admission: EM | Admit: 2014-11-30 | Discharge: 2014-11-30 | Disposition: A | Discharge status: Home / Self Care | Payer: Self-pay | Attending: Emergency Medicine | Admitting: Emergency Medicine

## 2014-11-30 ENCOUNTER — Encounter (HOSPITAL_COMMUNITY): Payer: Self-pay | Admitting: *Deleted

## 2014-11-30 DIAGNOSIS — S39012A Strain of muscle, fascia and tendon of lower back, initial encounter: Secondary | ICD-10-CM | POA: Insufficient documentation

## 2014-11-30 DIAGNOSIS — Y9289 Other specified places as the place of occurrence of the external cause: Secondary | ICD-10-CM | POA: Insufficient documentation

## 2014-11-30 DIAGNOSIS — Z8742 Personal history of other diseases of the female genital tract: Secondary | ICD-10-CM | POA: Insufficient documentation

## 2014-11-30 DIAGNOSIS — Y9389 Activity, other specified: Secondary | ICD-10-CM | POA: Insufficient documentation

## 2014-11-30 DIAGNOSIS — X58XXXA Exposure to other specified factors, initial encounter: Secondary | ICD-10-CM | POA: Insufficient documentation

## 2014-11-30 DIAGNOSIS — Z91040 Latex allergy status: Secondary | ICD-10-CM | POA: Insufficient documentation

## 2014-11-30 DIAGNOSIS — Y998 Other external cause status: Secondary | ICD-10-CM | POA: Insufficient documentation

## 2014-11-30 DIAGNOSIS — Z720 Tobacco use: Secondary | ICD-10-CM | POA: Insufficient documentation

## 2014-11-30 MED ORDER — IBUPROFEN 800 MG PO TABS: 800.0000 mg | ORAL_TABLET | Freq: Three times a day (TID) | ORAL | Status: DC

## 2014-11-30 MED ORDER — BACLOFEN 10 MG PO TABS: 10.0000 mg | ORAL_TABLET | Freq: Three times a day (TID) | ORAL | Status: AC

## 2014-11-30 MED ORDER — KETOROLAC TROMETHAMINE 10 MG PO TABS
10.0000 mg | ORAL_TABLET | Freq: Once | ORAL | Status: AC
  Administered 2014-11-30: 10 mg via ORAL
  Filled 2014-11-30: qty 1

## 2014-11-30 NOTE — Discharge Instructions (Signed)
Back Pain, Adult WARM TUB SOAKS TO YOU BACK DAILY, AND RESTING YOUR BACK AS MUCH AS POSSIBLE MAY BE HELPFUL. IBUPROFEN THREE TIMES DAILY WITH FOOD. USE BACLOFEN THREE TIMES DAILY. This medication may cause drowsiness. Please do not drink, drive, or participate in activity that requires concentration while taking this medication.                                                                                              Back pain is very common. The pain often gets better over time. The cause of back pain is usually not dangerous. Most people can learn to manage their back pain on their own.  HOME CARE   Stay active. Start with short walks on flat ground if you can. Try to walk farther each day.  Do not sit, drive, or stand in one place for more than 30 minutes. Do not stay in bed.  Do not avoid exercise or work. Activity can help your back heal faster.  Be careful when you bend or lift an object. Bend at your knees, keep the object close to you, and do not twist.  Sleep on a firm mattress. Lie on your side, and bend your knees. If you lie on your back, put a pillow under your knees.  Only take medicines as told by your doctor.  Put ice on the injured area.  Put ice in a plastic bag.  Place a towel between your skin and the bag.  Leave the ice on for 15-20 minutes, 03-04 times a day for the first 2 to 3 days. After that, you can switch between ice and heat packs.  Ask your doctor about back exercises or massage.  Avoid feeling anxious or stressed. Find good ways to deal with stress, such as exercise. GET HELP RIGHT AWAY IF:   Your pain does not go away with rest or medicine.  Your pain does not go away in 1 week.  You have new problems.  You do not feel well.  The pain spreads into your legs.  You cannot control when you poop (bowel movement) or pee (urinate).  Your arms or legs feel weak or lose feeling (numbness).  You feel sick to your stomach (nauseous) or throw up  (vomit).  You have belly (abdominal) pain.  You feel like you may pass out (faint). MAKE SURE YOU:   Understand these instructions.  Will watch your condition.  Will get help right away if you are not doing well or get worse. Document Released: 03/03/2008 Document Revised: 12/08/2011 Document Reviewed: 01/17/2014 [facility]ExitCare Patient Information 2015 [place], [place]. This information is not intended to replace advice given to you by your health care provider. Make sure you discuss any questions you have with your health care provider.

## 2014-11-30 NOTE — ED Notes (Signed)
Pt verbalized understanding of no driving and to use caution within 4 hours of taking pain meds due to meds cause drowsiness 

## 2014-11-30 NOTE — ED Provider Notes (Signed)
CSN: [id number]     Arrival date & time 11/30/14  1145 History   First MD Initiated Contact with Patient 11/30/14 1316     Chief Complaint  Patient presents with  . Back Pain     (Consider location/radiation/quality/duration/timing/severity/associated sxs/prior Treatment) Patient is a 29 y.o. female presenting with back pain. The history is provided by the patient.  Back Pain Location:  Lumbar spine Quality:  Aching Radiates to:  Does not radiate Pain severity:  Moderate Pain is:  Worse during the day Onset quality:  Gradual Duration:  3 days Timing:  Intermittent Progression:  Worsening Chronicity:  New Context: lifting heavy objects   Context: not falling   Relieved by:  Nothing Worsened by:  Standing Associated symptoms: no abdominal pain, no bladder incontinence, no bowel incontinence, no chest pain, no dysuria, no fever, no paresthesias and no perianal numbness   Risk factors: no recent surgery     Past Medical History  Diagnosis Date  . Eclamptic seizure   . BV (bacterial vaginosis)   . Screening for STD (sexually transmitted disease) 12/05/2013   Past Surgical History  Procedure Laterality Date  . No past surgeries    . Tubal ligation  06/28/2012    Procedure: POST PARTUM TUBAL LIGATION;  Surgeon: Tereso Newcomer, MD;  Location: [facility];  Service: Gynecology;  Laterality: Bilateral;  Post partum tubal ligation with filshie clips   Family History  Problem Relation Age of Onset  . Other Neg Hx   . Stroke Mother   . Diabetes Mother   . Hypertension Mother   . Cancer Mother     cervical   . Diabetes Sister   . Cancer Maternal Aunt    History  Substance Use Topics  . Smoking status: Current Every Day Smoker -- 0.50 packs/day for 10 years    Types: Cigarettes  . Smokeless tobacco: Never Used  . Alcohol Use: Yes     Comment: pt states she drinks a whole pint of liquor in a week.    OB History    Gravida Para Term Preterm AB TAB SAB Ectopic Multiple Living    0 0 0 0 0 2     Review of Systems  Constitutional: Negative for fever and activity change.       All ROS Neg except as noted in HPI  HENT: Negative.   Eyes: Negative for photophobia and discharge.  Respiratory: Negative for cough, shortness of breath and wheezing.   Cardiovascular: Negative for chest pain and palpitations.  Gastrointestinal: Negative for abdominal pain, blood in stool and bowel incontinence.  Genitourinary: Negative for bladder incontinence, dysuria, frequency and hematuria.  Musculoskeletal: Positive for back pain. Negative for arthralgias and neck pain.  Skin: Negative.   Neurological: Negative for dizziness, seizures, speech difficulty and paresthesias.  Psychiatric/Behavioral: Negative for hallucinations and confusion.      Allergies  Latex  Home Medications   Prior to Admission medications   Not on File   BP 137/83 mmHg  Pulse 78  Temp(Src) 98.2 F (36.8 C) (Oral)  Resp 20  Ht  (1.626 m)  Wt 173 lb (78.472 kg)  BMI 29.68 kg/m2  SpO2 100%  LMP 11/29/2014 Physical Exam  Constitutional: She is oriented to person, place, and time. She appears well-developed and well-nourished.  Non-toxic appearance.  HENT:  Head: Normocephalic.  Right Ear: Tympanic membrane and external ear normal.  Left Ear: Tympanic membrane and external ear normal.  Eyes: EOM  and lids are normal. Pupils are equal, round, and reactive to light.  Neck: Normal range of motion. Neck supple. Carotid bruit is not present.  Cardiovascular: Normal rate, regular rhythm, normal heart sounds, intact distal pulses and normal pulses.   Pulmonary/Chest: Breath sounds normal. No respiratory distress.  Abdominal: Soft. Bowel sounds are normal. There is no tenderness. There is no guarding.  Musculoskeletal: Normal range of motion.       Lumbar back: She exhibits tenderness and spasm. She exhibits no deformity.  Pain and spasm at the lumbar paraspinal area with ROM exercises.   Lymphadenopathy:       Head (right side): No submandibular adenopathy present.       Head (left side): No submandibular adenopathy present.    She has no cervical adenopathy.  Neurological: She is alert and oriented to person, place, and time. She has normal strength. No cranial nerve deficit or sensory deficit.  Skin: Skin is warm and dry.  Psychiatric: She has a normal mood and affect. Her speech is normal.  Nursing note and vitals reviewed.   ED Course  Procedures (including critical care time) Labs Review Labs Reviewed - No data to display  Imaging Review No results found.   EKG Interpretation None      MDM  Discussed findings with the patient. Vital signs are well within normal limits. Pulse oximetry is 100% on room air. No gross neurologic deficits appreciated on examination at this time. Suspect muscle strain of the lumbar area. Patient advised to use heat, to rest her back is much as possible, prescription for ibuprofen 800 mg 3 times daily, and baclofen 3 times daily given to the patient within structures that the medication may cause drowsiness. Patient will return if not improving.    Final diagnoses:  None    *I have reviewed nursing notes, vital signs, and all appropriate lab and imaging results for this patient.[address]**    Dimitra Woodstock M Mickelle Goupil, PA-C 11/30/14 1418  Donnetta HutchingBrian Cook, MD 11/30/14 1534

## 2014-11-30 NOTE — ED Notes (Signed)
Back pain, no injury,  No urinary sx, no fever.  Increased pain on getting up in am.

## 2016-06-20 ENCOUNTER — Emergency Department (HOSPITAL_COMMUNITY)
Admission: EM | Admit: 2016-06-20 | Discharge: 2016-06-20 | Disposition: A | Discharge status: Home / Self Care | Payer: Self-pay | Attending: Emergency Medicine | Admitting: Emergency Medicine

## 2016-06-20 ENCOUNTER — Encounter (HOSPITAL_COMMUNITY): Payer: Self-pay | Admitting: *Deleted

## 2016-06-20 DIAGNOSIS — Z791 Long term (current) use of non-steroidal anti-inflammatories (NSAID): Secondary | ICD-10-CM | POA: Insufficient documentation

## 2016-06-20 DIAGNOSIS — J069 Acute upper respiratory infection, unspecified: Secondary | ICD-10-CM | POA: Insufficient documentation

## 2016-06-20 DIAGNOSIS — F17210 Nicotine dependence, cigarettes, uncomplicated: Secondary | ICD-10-CM | POA: Insufficient documentation

## 2016-06-20 DIAGNOSIS — Z79899 Other long term (current) drug therapy: Secondary | ICD-10-CM | POA: Insufficient documentation

## 2016-06-20 MED ORDER — ALBUTEROL SULFATE HFA 108 (90 BASE) MCG/ACT IN AERS
2.0000 | INHALATION_SPRAY | Freq: Once | RESPIRATORY_TRACT | Status: AC
  Administered 2016-06-20: 2 via RESPIRATORY_TRACT
  Filled 2016-06-20: qty 6.7

## 2016-06-20 MED ORDER — FLUTICASONE PROPIONATE 50 MCG/ACT NA SUSP: 2.0000 | Freq: Every day | NASAL | 0 refills | Status: DC

## 2016-06-20 MED ORDER — CETIRIZINE HCL 10 MG PO TABS: 10.0000 mg | ORAL_TABLET | Freq: Every day | ORAL | 1 refills | Status: DC

## 2016-06-20 MED ORDER — BENZONATATE 100 MG PO CAPS: 100.0000 mg | ORAL_CAPSULE | Freq: Three times a day (TID) | ORAL | 0 refills | Status: DC | PRN

## 2016-06-20 MED ORDER — NAPROXEN 250 MG PO TABS: 250.0000 mg | ORAL_TABLET | Freq: Two times a day (BID) | ORAL | 0 refills | Status: DC

## 2016-06-20 NOTE — ED Triage Notes (Signed)
Pt comes in with cough and congestion starting 4 days ago. Denies any n/v/d or fevers. NAD noted.

## 2016-06-20 NOTE — ED Provider Notes (Signed)
[facility] Provider Note   CSN: [id number] Arrival date & time: 06/20/16  1609     History   Chief Complaint Chief Complaint  Patient presents with  . Cough    HPI Holly Joseph is a 30 y.o. female.  Holly Joseph is a 30 y.o. Female who presents to the ED complaining of 4 days of sneezing, nasal congestion, post nasal drip and coughing. She reports pain in her chest only with coughing today. She reports some wheezing today. She is a smoker. She denies fevers. She has not attempted any treatments today. She denies fevers, trouble swallowing, neck pain, headaches, hemoptysis, shortness of breath, abdominal pain, nausea, vomiting, diarrhea or rashes.   The history is provided by the patient. No language interpreter was used.  Cough  Associated symptoms include wheezing. Pertinent negatives include no chest pain, no chills, no headaches, no sore throat, no myalgias and no shortness of breath.    Past Medical History:  Diagnosis Date  . BV (bacterial vaginosis)   . Eclamptic seizure   . Screening for STD (sexually transmitted disease) 12/05/2013    Patient Active Problem List   Diagnosis Date Noted  . Screening for STD (sexually transmitted disease) 12/05/2013  . NSVD (normal spontaneous vaginal delivery) 06/28/2012  . Desires Sterilization 06/28/2012    Past Surgical History:  Procedure Laterality Date  . NO PAST SURGERIES    . TUBAL LIGATION  06/28/2012   Procedure: POST PARTUM TUBAL LIGATION;  Surgeon: Tereso Newcomer, MD;  Location: [facility];  Service: Gynecology;  Laterality: Bilateral;  Post partum tubal ligation with filshie clips    OB History    Gravida Para Term Preterm AB Living   2 2 1 1  0 2   SAB TAB Ectopic Multiple Live Births   0 0 0 0 1       Home Medications    Prior to Admission medications   Medication Sig Start Date End Date Taking? Authorizing Provider  benzonatate (TESSALON) 100 MG capsule Take 1 capsule (100 mg total) by  mouth 3 (three) times daily as needed for cough. 06/20/16   Everlene Farrier, PA-C  cetirizine (ZYRTEC ALLERGY) 10 MG tablet Take 1 tablet (10 mg total) by mouth daily. 06/20/16   Everlene Farrier, PA-C  fluticasone (FLONASE) 50 MCG/ACT nasal spray Place 2 sprays into both nostrils daily. 06/20/16   Everlene Farrier, PA-C  ibuprofen (ADVIL,MOTRIN) 800 MG tablet Take 1 tablet (800 mg total) by mouth 3 (three) times daily. 11/30/14   Ivery Quale, PA-C  naproxen (NAPROSYN) 250 MG tablet Take 1 tablet (250 mg total) by mouth 2 (two) times daily with a meal. As needed for pain 06/20/16   Everlene Farrier, PA-C    Family History Family History  Problem Relation Age of Onset  . Stroke Mother   . Diabetes Mother   . Hypertension Mother   . Cancer Mother     cervical   . Diabetes Sister   . Cancer Maternal Aunt   . Other Neg Hx     Social History Social History  Substance Use Topics  . Smoking status: Current Every Day Smoker    Packs/day: 0.25    Years: 10.00    Types: Cigarettes  . Smokeless tobacco: Never Used  . Alcohol use Yes     Comment: pt states she drinks a whole pint of liquor in a week.      Allergies   Latex   Review of Systems Review of  Systems  Constitutional: Negative for chills and fever.  HENT: Negative for congestion and sore throat.   Eyes: Negative for visual disturbance.  Respiratory: Positive for cough and wheezing. Negative for shortness of breath.   Cardiovascular: Negative for chest pain and palpitations.  Gastrointestinal: Negative for abdominal pain, diarrhea, nausea and vomiting.  Genitourinary: Negative for dysuria.  Musculoskeletal: Negative for back pain, myalgias and neck pain.  Skin: Negative for rash.  Neurological: Negative for headaches.     Physical Exam Updated Vital Signs BP 132/83   Pulse 111   Temp 99.1 F (37.3 C) (Oral)   Resp 18   Ht 5\' 3"  (1.6 m)   Wt 80.7 kg   LMP 05/21/2016   SpO2 96%   BMI 31.53 kg/m   Physical Exam    Constitutional: She appears well-developed and well-nourished. No distress.  Nontoxic appearing.  HENT:  Head: Normocephalic and atraumatic.  Right Ear: External ear normal.  Left Ear: External ear normal.  Mouth/Throat: Oropharynx is clear and moist. No oropharyngeal exudate.  Mild middle ear effusion noted bilaterally. No TM erythema or loss of landmarks. No tonsillar hypertrophy or exudates. Uvula is midline without edema. Evidence of postnasal drip. Boggy nasal turbinates bilaterally.  Eyes: Conjunctivae are normal. Pupils are equal, round, and reactive to light. Right eye exhibits no discharge. Left eye exhibits no discharge.  Neck: Neck supple. No JVD present. No tracheal deviation present.  Cardiovascular: Normal rate, regular rhythm, normal heart sounds and intact distal pulses.   HR is 96.   Pulmonary/Chest: Effort normal and breath sounds normal. No stridor. No respiratory distress. She has no wheezes. She has no rales.  Lungs are clear to auscultation bilaterally. No increased work of breathing. No rales or rhonchi. No wheezing.  Abdominal: Soft. There is no tenderness.  Lymphadenopathy:    She has no cervical adenopathy.  Neurological: She is alert. Coordination normal.  Skin: Skin is warm and dry. Capillary refill takes less than 2 seconds. No rash noted. She is not diaphoretic.  Psychiatric: She has a normal mood and affect. Her behavior is normal.  Nursing note and vitals reviewed.    ED Treatments / Results  Labs (all labs ordered are listed, but only abnormal results are displayed) Labs Reviewed - No data to display  EKG  EKG Interpretation None       Radiology No results found.  Procedures Procedures (including critical care time)  Medications Ordered in ED Medications  albuterol (PROVENTIL HFA;VENTOLIN HFA) 108 (90 Base) MCG/ACT inhaler 2 puff (not administered)     Initial Impression / Assessment and Plan / ED Course  I have reviewed the triage  vital signs and the nursing notes.  Pertinent labs & imaging results that were available during my care of the patient were reviewed by me and considered in my medical decision making (see chart for details).  Clinical Course   This is a 30 y.o. Female who presents to the ED complaining of 4 days of sneezing, nasal congestion, post nasal drip and coughing. No fevers. No SOB.  On exam the patient is afebrile and nontoxic appearing. She is evidence of postnasal drip. Boggy nasal treatments bilaterally. Mild middle ear effusion noted bilaterally. Lungs are clear to auscultation bilaterally. No wheezes or rhonchi. Patient with upper respiratory infection. I see no need for chest x-ray is a patient denies any fevers and she is currently afebrile nontoxic appearing. She reports subjective wheezing so we will provide her with an albuterol inhaler in  the emergency department to use as needed. I heard no wheezing on exam. Will start on Flonase, Zyrtec and Tessalon Perles. I advised that she has increased trouble breathing or fever she needs to return to the emergency room for possible chest x-ray.  I advised the patient to follow-up with their primary care provider this week. I advised the patient to return to the emergency department with new or worsening symptoms or new concerns. The patient verbalized understanding and agreement with plan.     Final Clinical Impressions(s) / ED Diagnoses   Final diagnoses:  URI (upper respiratory infection)    New Prescriptions New Prescriptions   BENZONATATE (TESSALON) 100 MG CAPSULE    Take 1 capsule (100 mg total) by mouth 3 (three) times daily as needed for cough.   CETIRIZINE (ZYRTEC ALLERGY) 10 MG TABLET    Take 1 tablet (10 mg total) by mouth daily.   FLUTICASONE (FLONASE) 50 MCG/ACT NASAL SPRAY    Place 2 sprays into both nostrils daily.   NAPROXEN (NAPROSYN) 250 MG TABLET    Take 1 tablet (250 mg total) by mouth 2 (two) times daily with a meal. As needed for  pain     Everlene FarrierWilliam Leelynn Whetsel, PA-C 06/20/16 1706    Bethann BerkshireJoseph Zammit, MD 06/23/16 2005

## 2017-01-13 ENCOUNTER — Emergency Department (HOSPITAL_COMMUNITY)
Admission: EM | Admit: 2017-01-13 | Discharge: 2017-01-13 | Disposition: A | Discharge status: Home / Self Care | Payer: Self-pay | Attending: Emergency Medicine | Admitting: Emergency Medicine

## 2017-01-13 ENCOUNTER — Encounter (HOSPITAL_COMMUNITY): Payer: Self-pay

## 2017-01-13 DIAGNOSIS — F17210 Nicotine dependence, cigarettes, uncomplicated: Secondary | ICD-10-CM | POA: Insufficient documentation

## 2017-01-13 DIAGNOSIS — K047 Periapical abscess without sinus: Secondary | ICD-10-CM | POA: Insufficient documentation

## 2017-01-13 MED ORDER — PENICILLIN V POTASSIUM 250 MG PO TABS
500.0000 mg | ORAL_TABLET | Freq: Once | ORAL | Status: AC
  Administered 2017-01-13: 500 mg via ORAL
  Filled 2017-01-13: qty 2

## 2017-01-13 MED ORDER — BUPIVACAINE HCL (PF) 0.5 % IJ SOLN
10.0000 mL | Freq: Once | INTRAMUSCULAR | Status: AC
  Administered 2017-01-13: 10 mL
  Filled 2017-01-13: qty 30

## 2017-01-13 MED ORDER — PENICILLIN V POTASSIUM 500 MG PO TABS: 500.0000 mg | ORAL_TABLET | Freq: Four times a day (QID) | ORAL | 0 refills | Status: AC

## 2017-01-13 MED ORDER — IBUPROFEN 800 MG PO TABS: 800.0000 mg | ORAL_TABLET | Freq: Three times a day (TID) | ORAL | 0 refills | Status: DC | PRN

## 2017-01-13 NOTE — Discharge Instructions (Signed)
You have a dental infection. It is very important that you get evaluated by a dentist as soon as possible. Call tomorrow to schedule an appointment. Ibuprofen as needed for pain. Take your full course of antibiotics. It will be very important to gargle with salt water at least 2-3 times a day to keep mouth clean and prevent worsening infection. Read the instructions below.  Eat a soft or liquid diet and rinse your mouth out after meals with warm water. You should see a dentist or return here at once if you have increased swelling, increased pain or uncontrolled bleeding from the site of your injury.  SEEK MEDICAL CARE IF:  You have increased pain not controlled with medicines.  You have swelling around your tooth, in your face or neck.  You have bleeding which starts, continues, or gets worse.  You have a fever >101 If you are unable to open your mouth

## 2017-01-13 NOTE — ED Provider Notes (Signed)
[facility] Provider Note   CSN: [id number] Arrival date & time: 01/13/17  8295     History   Chief Complaint Chief Complaint  Patient presents with  . Dental Pain    HPI KELIA GIBBON is a 31 y.o. female.  The history is provided by the patient and medical records. No language interpreter was used.  Dental Pain      LISMARY KIEHN is a 31 y.o. female who presents to the Emergency Department complaining of gradually worsening, right-sided, lower dental pain beginning x 3 days. Woke up this morning and lower jaw was swollen. History of broken tooth one year ago, but never experienced pain like this. Pt describes their pain as  throbbing. Pt has been taking ibuprofen at home with minimal relief of pain. They are not currently followed by dentistry, but do have a clinic name/number that was recommended which they plan on calling today.  Pt denies fever, chills, difficulty breathing, difficulty swallowing.    Past Medical History:  Diagnosis Date  . BV (bacterial vaginosis)   . Eclamptic seizure   . Screening for STD (sexually transmitted disease) 12/05/2013    Patient Active Problem List   Diagnosis Date Noted  . Screening for STD (sexually transmitted disease) 12/05/2013  . NSVD (normal spontaneous vaginal delivery) 06/28/2012  . Desires Sterilization 06/28/2012    Past Surgical History:  Procedure Laterality Date  . NO PAST SURGERIES    . TUBAL LIGATION  06/28/2012   Procedure: POST PARTUM TUBAL LIGATION;  Surgeon: Tereso Newcomer, MD;  Location: [facility];  Service: Gynecology;  Laterality: Bilateral;  Post partum tubal ligation with filshie clips    OB History    Gravida Para Term Preterm AB Living   0 2   SAB TAB Ectopic Multiple Live Births   0 0 0 0 1       Home Medications    Prior to Admission medications   Medication Sig Start Date End Date Taking? Authorizing Provider  benzonatate (TESSALON) 100 MG capsule Take 1 capsule (100 mg  total) by mouth 3 (three) times daily as needed for cough. 06/20/16   Everlene Farrier, PA-C  cetirizine (ZYRTEC ALLERGY) 10 MG tablet Take 1 tablet (10 mg total) by mouth daily. 06/20/16   Everlene Farrier, PA-C  fluticasone (FLONASE) 50 MCG/ACT nasal spray Place 2 sprays into both nostrils daily. 06/20/16   Everlene Farrier, PA-C  ibuprofen (ADVIL,MOTRIN) 800 MG tablet Take 1 tablet (800 mg total) by mouth every 8 (eight) hours as needed. 01/13/17   Chase Picket Angelica Frandsen, PA-C  naproxen (NAPROSYN) 250 MG tablet Take 1 tablet (250 mg total) by mouth 2 (two) times daily with a meal. As needed for pain 06/20/16   Everlene Farrier, PA-C  penicillin v potassium (VEETID) 500 MG tablet Take 1 tablet (500 mg total) by mouth 4 (four) times daily. 01/13/17 01/20/17  Chase Picket Sophea Rackham, PA-C    Family History Family History  Problem Relation Age of Onset  . Stroke Mother   . Diabetes Mother   . Hypertension Mother   . Cancer Mother     cervical   . Diabetes Sister   . Cancer Maternal Aunt   . Other Neg Hx     Social History Social History  Substance Use Topics  . Smoking status: Current Every Day Smoker    Packs/day: 0.50    Years: 10.00    Types: Cigarettes  . Smokeless tobacco: Never Used  .  Alcohol use No     Allergies   Latex   Review of Systems Review of Systems  Constitutional: Negative for fever.  HENT: Positive for dental problem and facial swelling. Negative for trouble swallowing.   Respiratory: Negative for shortness of breath.      Physical Exam Updated Vital Signs BP (!) 127/93   Pulse (!) 113   Temp 97.5 F (36.4 C) (Temporal)   Resp 18   Ht  (1.6 m)   Wt 78.5 kg   LMP 12/11/2016   SpO2 100%   BMI 30.65 kg/m   Physical Exam  Constitutional: She appears well-developed and well-nourished. No distress.  HENT:  Head: Normocephalic and atraumatic.  Mouth/Throat:    Poor oral dentition noted, midline uvula, no trismus, oropharynx moist and clear, no oropharyngeal  erythema or edema, neck supple and no tenderness.   Neck: Neck supple.  Cardiovascular: Normal rate, regular rhythm and normal heart sounds.   No murmur heard. Pulmonary/Chest: Effort normal and breath sounds normal. No respiratory distress. She has no wheezes. She has no rales.  Musculoskeletal: Normal range of motion.  Neurological: She is alert.  Skin: Skin is warm and dry.  Nursing note and vitals reviewed.    ED Treatments / Results  Labs (all labs ordered are listed, but only abnormal results are displayed) Labs Reviewed - No data to display  EKG  EKG Interpretation None       Radiology No results found.  Procedures Procedures (including critical care time)  NERVE BLOCK Performed by: Chase Picket Ernestyne Caldwell Consent: Verbal consent obtained. Required items: required blood products, implants, devices, and special equipment available Time out: Immediately prior to procedure a "time out" was called to verify the correct patient, procedure, equipment, support staff and site/side marked as required. Indication: Pain relief Nerve block body site: dental Preparation: Patient was prepped and draped in the usual sterile fashion. Needle gauge: 25 G Location technique: anatomical landmarks Local anesthetic: Marcaine 0.5% Anesthetic total: 3 ml Outcome: pain improved Patient tolerance: Patient tolerated the procedure well with no immediate complications.  INCISION AND DRAINAGE Performed by: Chase Picket Ladasia Sircy Consent: Verbal consent obtained. Risks and benefits: risks, benefits and alternatives were discussed Type: abscess Body area: Lower dental Anesthesia: dental block as above. Incision was made with a scalpel. Complexity: simple Drainage: purulent Drainage amount: small Patient tolerance: Patient tolerated the procedure well with no immediate complications.    Medications Ordered in ED Medications  bupivacaine (MARCAINE) 0.5 % injection 10 mL (not administered)    penicillin v potassium (VEETID) tablet 500 mg (not administered)     Initial Impression / Assessment and Plan / ED Course  I have reviewed the triage vital signs and the nursing notes.  Pertinent labs & imaging results that were available during my care of the patient were reviewed by me and considered in my medical decision making (see chart for details).    Patient with dentalgia. Exam with abscess. Discussed risks/benefits of  I&D today vs. ABX alone. Patient elects to proceed with dental block and I&D which were performed as above. Patient tolerated procedure well. Patient is afebrile, non toxic appearing, and swallowing secretions well. Exam not concerning for Ludwig's angina or pharyngeal abscess. Will treat with PenVK. I provided dental resource guide and stressed the importance of dental follow up for ultimate management of dental pain. Patient voices understanding and is agreeable to plan.   Final Clinical Impressions(s) / ED Diagnoses   Final diagnoses:  Dental abscess  New Prescriptions New Prescriptions   IBUPROFEN (ADVIL,MOTRIN) 800 MG TABLET    Take 1 tablet (800 mg total) by mouth every 8 (eight) hours as needed.   PENICILLIN V POTASSIUM (VEETID) 500 MG TABLET    Take 1 tablet (500 mg total) by mouth 4 (four) times daily.     [facility] Pearley Millington, PA-C 01/13/17 1610    Blane Ohara, MD 01/13/17 [id number]

## 2017-01-13 NOTE — ED Triage Notes (Signed)
Pt reports that she has a broken tooth lower right side for approx one year. She woke up this morning with jaw swollen

## 2017-04-11 ENCOUNTER — Encounter (HOSPITAL_COMMUNITY): Payer: Self-pay | Admitting: Emergency Medicine

## 2017-04-11 ENCOUNTER — Emergency Department (HOSPITAL_COMMUNITY)
Admission: EM | Admit: 2017-04-11 | Discharge: 2017-04-11 | Disposition: A | Discharge status: Home / Self Care | Payer: Self-pay | Attending: Emergency Medicine | Admitting: Emergency Medicine

## 2017-04-11 DIAGNOSIS — F17210 Nicotine dependence, cigarettes, uncomplicated: Secondary | ICD-10-CM | POA: Insufficient documentation

## 2017-04-11 DIAGNOSIS — L03012 Cellulitis of left finger: Secondary | ICD-10-CM | POA: Insufficient documentation

## 2017-04-11 DIAGNOSIS — Z79899 Other long term (current) drug therapy: Secondary | ICD-10-CM | POA: Insufficient documentation

## 2017-04-11 DIAGNOSIS — Z91040 Latex allergy status: Secondary | ICD-10-CM | POA: Insufficient documentation

## 2017-04-11 MED ORDER — SULFAMETHOXAZOLE-TRIMETHOPRIM 800-160 MG PO TABS: 1.0000 | ORAL_TABLET | Freq: Two times a day (BID) | ORAL | 0 refills | Status: AC

## 2017-04-11 NOTE — ED Provider Notes (Signed)
[facility] Provider Note   CSN: [id number] Arrival date & time: 04/11/17  1304     History   Chief Complaint Chief Complaint  Patient presents with  . Hand Pain    HPI Holly BrawnMichelle D Joseph is a 31 y.o. female.  HPI  The patient is a 31 year old female, denies any significant prior medical history and at this time only takes a prenatal vitamin. She has had several days of increased swelling of her left index finger. She reports that this is a chronic problem over the last 2 weeks, it has been bigger and smaller but today seems to be gradually bigger. She has no drainage no fevers and no redness.  Past Medical History:  Diagnosis Date  . BV (bacterial vaginosis)   . Eclamptic seizure   . Screening for STD (sexually transmitted disease) 12/05/2013    Patient Active Problem List   Diagnosis Date Noted  . Screening for STD (sexually transmitted disease) 12/05/2013  . NSVD (normal spontaneous vaginal delivery) 06/28/2012  . Desires Sterilization 06/28/2012    Past Surgical History:  Procedure Laterality Date  . NO PAST SURGERIES    . TUBAL LIGATION  06/28/2012   Procedure: POST PARTUM TUBAL LIGATION;  Surgeon: Tereso NewcomerUgonna A Anyanwu, MD;  Location: [facility];  Service: Gynecology;  Laterality: Bilateral;  Post partum tubal ligation with filshie clips    OB History    Gravida Para Term Preterm AB Living   2 2 1 1  0 2   SAB TAB Ectopic Multiple Live Births   0 0 0 0 1       Home Medications    Prior to Admission medications   Medication Sig Start Date End Date Taking? Authorizing Provider  benzonatate (TESSALON) 100 MG capsule Take 1 capsule (100 mg total) by mouth 3 (three) times daily as needed for cough. 06/20/16   Everlene Farrieransie, William, PA-C  cetirizine (ZYRTEC ALLERGY) 10 MG tablet Take 1 tablet (10 mg total) by mouth daily. 06/20/16   Everlene Farrieransie, William, PA-C  fluticasone (FLONASE) 50 MCG/ACT nasal spray Place 2 sprays into both nostrils daily. 06/20/16   Everlene Farrieransie, William, PA-C    ibuprofen (ADVIL,MOTRIN) 800 MG tablet Take 1 tablet (800 mg total) by mouth every 8 (eight) hours as needed. 01/13/17   Ward, Chase PicketJaime Pilcher, PA-C  naproxen (NAPROSYN) 250 MG tablet Take 1 tablet (250 mg total) by mouth 2 (two) times daily with a meal. As needed for pain 06/20/16   Everlene Farrieransie, William, PA-C    Family History Family History  Problem Relation Age of Onset  . Stroke Mother   . Diabetes Mother   . Hypertension Mother   . Cancer Mother        cervical   . Diabetes Sister   . Cancer Maternal Aunt   . Other Neg Hx     Social History Social History  Substance Use Topics  . Smoking status: Current Every Day Smoker    Packs/day: 0.50    Years: 10.00    Types: Cigarettes  . Smokeless tobacco: Never Used  . Alcohol use No     Allergies   Latex   Review of Systems Review of Systems  Constitutional: Negative for fever.  Musculoskeletal: Positive for joint swelling.     Physical Exam Updated Vital Signs BP 115/76   Pulse 63   Temp 98.3 F (36.8 C)   Resp 18   Ht 5\' 4"  (1.626 m)   Wt 68 kg (150 lb)   LMP 04/11/2017  SpO2 100%   BMI 25.75 kg/m   Physical Exam  Constitutional: She appears well-developed and well-nourished.  HENT:  Head: Normocephalic and atraumatic.  Eyes: Conjunctivae are normal. Right eye exhibits no discharge. Left eye exhibits no discharge.  Pulmonary/Chest: Effort normal. No respiratory distress.  Musculoskeletal:  Small paronychia over the distal left index finger, no felon, supple joints  Neurological: She is alert. Coordination normal.  Skin: Skin is warm and dry. No rash noted. She is not diaphoretic. No erythema.  Psychiatric: She has a normal mood and affect.  Nursing note and vitals reviewed.    ED Treatments / Results  Labs (all labs ordered are listed, but only abnormal results are displayed) Labs Reviewed - No data to display   Radiology No results found.  Procedures Procedures (including critical care  time)  Medications Ordered in ED Medications - No data to display   Initial Impression / Assessment and Plan / ED Course  I have reviewed the triage vital signs and the nursing notes.  Pertinent labs & imaging results that were available during my care of the patient were reviewed by me and considered in my medical decision making (see chart for details).     Paronychia - very small - no indication for drainage Warm soaks and Bactrim Pt in agreement  Final Clinical Impressions(s) / ED Diagnoses   Final diagnoses:  None    New Prescriptions New Prescriptions   No medications on file     Eber Hong, MD 04/11/17 1317

## 2017-04-11 NOTE — Discharge Instructions (Signed)
Please obtain all of your results from medical records or have your doctors office obtain the results - share them with your doctor - you should be seen at your doctors office in the next 2 days. Call today to arrange your follow up. Take the medications as prescribed. Please review all of the medicines and only take them if you do not have an allergy to them. Please be aware that if you are taking birth control pills, taking other prescriptions, ESPECIALLY ANTIBIOTICS may make the birth control ineffective - if this is the case, either do not engage in sexual activity or use alternative methods of birth control such as condoms until you have finished the medicine and your family doctor says it is OK to restart them. If you are on a blood thinner such as COUMADIN, be aware that any other medicine that you take may cause the coumadin to either work too much, or not enough - you should have your coumadin level rechecked in next 7 days if this is the case.  °?  °It is also a possibility that you have an allergic reaction to any of the medicines that you have been prescribed - Everybody reacts differently to medications and while MOST people have no trouble with most medicines, you may have a reaction such as nausea, vomiting, rash, swelling, shortness of breath. If this is the case, please stop taking the medicine immediately and contact your physician.  °?  °You should return to the ER if you develop severe or worsening symptoms.  ° °[facility] Doctor List ° ° ° °Edward Hawkins MD. Specialty: Pulmonary Disease Contact information: [address]  °[address]  °[place] [address]  °[phone]  ° °Margaret Simpson, MD. Specialty: Family Medicine Contact information: [address]  °[place] [address]  °[phone]  ° °Scott Luking, MD. Specialty: Family Medicine Contact information: [address]  °[address]  °[place] [address]  °[phone]  ° °Tesfaye Fanta, MD Specialty:  Internal Medicine Contact information: [address]  °[place] [address]  °[phone]  ° °Zach Hall, MD. Specialty: Internal Medicine Contact information: [address]  °[place] [address]  °[phone]  ° ° [facility] (Dr. Kim) Specialty: Family Medicine Contact information: [address]  °[place] [address]  °[phone]  ° °Stephen Knowlton, MD. Specialty: Family Medicine Contact information: [address]  °[address]  °[place] [address]  °[phone]  ° °Roy Fagan, MD. Specialty: Internal Medicine Contact information: [address]  °[address]  °[place] [address]  °[phone]  ° ° °[facility] ° °[address] °[place], [place] [address] [phone] ° °Services °[facility] offers a variety of basic health services. ° °Services include but are not limited to: °Blood pressure checks  °Heart rate checks  °Blood sugar checks  °Urine analysis  °Rapid strep tests  °Pregnancy tests.  °Health education and referrals ° °People needing more complex services will be directed to a physician online. Using these virtual visits, doctors can evaluate and prescribe medicine and treatments. °There will be no medication on-site, though [facility] will help patients fill their prescriptions at little to no cost. ° ° °For More information please go to: °[email or web address] ° °

## 2017-04-11 NOTE — ED Triage Notes (Signed)
Left index finger swelling x 2 weeks.

## 2019-09-12 ENCOUNTER — Other Ambulatory Visit: Payer: Self-pay

## 2019-09-12 ENCOUNTER — Encounter (HOSPITAL_COMMUNITY): Payer: Self-pay | Admitting: Emergency Medicine

## 2019-09-12 ENCOUNTER — Emergency Department (HOSPITAL_COMMUNITY)
Admission: EM | Admit: 2019-09-12 | Discharge: 2019-09-12 | Disposition: A | Discharge status: Home / Self Care | Payer: Self-pay | Attending: Emergency Medicine | Admitting: Emergency Medicine

## 2019-09-12 DIAGNOSIS — Z5321 Procedure and treatment not carried out due to patient leaving prior to being seen by health care provider: Secondary | ICD-10-CM | POA: Insufficient documentation

## 2019-09-12 DIAGNOSIS — R002 Palpitations: Secondary | ICD-10-CM | POA: Insufficient documentation

## 2019-09-12 DIAGNOSIS — F419 Anxiety disorder, unspecified: Secondary | ICD-10-CM | POA: Insufficient documentation

## 2019-09-12 NOTE — ED Triage Notes (Signed)
Pt reports anxiety and palpitations for several weeks. States she has been drinking tonight, "more than enough".

## 2019-09-12 NOTE — ED Notes (Signed)
Per registration pt left facility. 

## 2019-12-26 ENCOUNTER — Emergency Department (HOSPITAL_COMMUNITY): Payer: Self-pay

## 2019-12-26 ENCOUNTER — Inpatient Hospital Stay (HOSPITAL_COMMUNITY)
Admission: EM | Admit: 2019-12-26 | Discharge: 2019-12-31 | DRG: 440 | Disposition: A | Discharge status: Home / Self Care | Payer: Self-pay | Attending: Family Medicine | Admitting: Family Medicine

## 2019-12-26 ENCOUNTER — Encounter (HOSPITAL_COMMUNITY): Payer: Self-pay

## 2019-12-26 ENCOUNTER — Other Ambulatory Visit: Payer: Self-pay

## 2019-12-26 DIAGNOSIS — F17210 Nicotine dependence, cigarettes, uncomplicated: Secondary | ICD-10-CM | POA: Diagnosis present

## 2019-12-26 DIAGNOSIS — R935 Abnormal findings on diagnostic imaging of other abdominal regions, including retroperitoneum: Secondary | ICD-10-CM

## 2019-12-26 DIAGNOSIS — K852 Alcohol induced acute pancreatitis without necrosis or infection: Secondary | ICD-10-CM

## 2019-12-26 DIAGNOSIS — I10 Essential (primary) hypertension: Secondary | ICD-10-CM | POA: Diagnosis present

## 2019-12-26 DIAGNOSIS — K8521 Alcohol induced acute pancreatitis with uninfected necrosis: Principal | ICD-10-CM | POA: Diagnosis present

## 2019-12-26 DIAGNOSIS — Z823 Family history of stroke: Secondary | ICD-10-CM

## 2019-12-26 DIAGNOSIS — Z809 Family history of malignant neoplasm, unspecified: Secondary | ICD-10-CM

## 2019-12-26 DIAGNOSIS — K859 Acute pancreatitis without necrosis or infection, unspecified: Secondary | ICD-10-CM

## 2019-12-26 DIAGNOSIS — Z8249 Family history of ischemic heart disease and other diseases of the circulatory system: Secondary | ICD-10-CM

## 2019-12-26 DIAGNOSIS — Z833 Family history of diabetes mellitus: Secondary | ICD-10-CM

## 2019-12-26 DIAGNOSIS — Z91040 Latex allergy status: Secondary | ICD-10-CM

## 2019-12-26 DIAGNOSIS — K760 Fatty (change of) liver, not elsewhere classified: Secondary | ICD-10-CM | POA: Diagnosis present

## 2019-12-26 DIAGNOSIS — R509 Fever, unspecified: Secondary | ICD-10-CM

## 2019-12-26 DIAGNOSIS — Z720 Tobacco use: Secondary | ICD-10-CM | POA: Diagnosis present

## 2019-12-26 DIAGNOSIS — F1010 Alcohol abuse, uncomplicated: Secondary | ICD-10-CM | POA: Diagnosis present

## 2019-12-26 DIAGNOSIS — Z20822 Contact with and (suspected) exposure to covid-19: Secondary | ICD-10-CM | POA: Diagnosis present

## 2019-12-26 DIAGNOSIS — R739 Hyperglycemia, unspecified: Secondary | ICD-10-CM | POA: Diagnosis present

## 2019-12-26 DIAGNOSIS — E876 Hypokalemia: Secondary | ICD-10-CM | POA: Diagnosis present

## 2019-12-26 DIAGNOSIS — Z808 Family history of malignant neoplasm of other organs or systems: Secondary | ICD-10-CM

## 2019-12-26 DIAGNOSIS — K8689 Other specified diseases of pancreas: Secondary | ICD-10-CM

## 2019-12-26 LAB — COMPREHENSIVE METABOLIC PANEL
ALT: 54 U/L — ABNORMAL HIGH (ref 0–44)
AST: 101 U/L — ABNORMAL HIGH (ref 15–41)
Albumin: 4.3 g/dL (ref 3.5–5.0)
Alkaline Phosphatase: 67 U/L (ref 38–126)
Anion gap: 17 — ABNORMAL HIGH (ref 5–15)
BUN: 10 mg/dL (ref 6–20)
CO2: 19 mmol/L — ABNORMAL LOW (ref 22–32)
Calcium: 9.2 mg/dL (ref 8.9–10.3)
Chloride: 101 mmol/L (ref 98–111)
Creatinine, Ser: 0.68 mg/dL (ref 0.44–1.00)
GFR calc Af Amer: >60 mL/min (ref >60)
GFR calc non Af Amer: >60 mL/min (ref >60)
Glucose, Bld: 127 mg/dL — ABNORMAL HIGH (ref 70–99)
Potassium: 3.3 mmol/L — ABNORMAL LOW (ref 3.5–5.1)
Sodium: 137 mmol/L (ref 135–145)
Total Bilirubin: 1 mg/dL (ref 0.3–1.2)
Total Protein: 7.2 g/dL (ref 6.5–8.1)

## 2019-12-26 LAB — CBC
HCT: 43.3 % (ref 36.0–46.0)
Hemoglobin: 14.8 g/dL (ref 12.0–15.0)
MCH: 33.3 pg (ref 26.0–34.0)
MCHC: 34.2 g/dL (ref 30.0–36.0)
MCV: 97.5 fL (ref 80.0–100.0)
Platelets: 166 10*3/uL (ref 150–400)
RBC: 4.44 MIL/uL (ref 3.87–5.11)
RDW: 14.2 % (ref 11.5–15.5)
WBC: 9.7 10*3/uL (ref 4.0–10.5)
nRBC: 0 % (ref 0.0–0.2)

## 2019-12-26 LAB — URINALYSIS, ROUTINE W REFLEX MICROSCOPIC
Bilirubin Urine: NEGATIVE
Glucose, UA: NEGATIVE mg/dL
Ketones, ur: 80 mg/dL — AB
Leukocytes,Ua: NEGATIVE
Nitrite: NEGATIVE
Protein, ur: 100 mg/dL — AB
RBC / HPF: >50 RBC/hpf — ABNORMAL HIGH (ref 0–5)
Specific Gravity, Urine: 1.028 (ref 1.005–1.030)
pH: 6 (ref 5.0–8.0)

## 2019-12-26 LAB — TROPONIN I (HIGH SENSITIVITY)
Troponin I (High Sensitivity): 2 ng/L (ref <18)
Troponin I (High Sensitivity): 3 ng/L (ref <18)

## 2019-12-26 LAB — SARS CORONAVIRUS 2 (TAT 6-24 HRS): SARS Coronavirus 2: NEGATIVE

## 2019-12-26 LAB — LIPASE, BLOOD: Lipase: 1664 U/L — ABNORMAL HIGH (ref 11–51)

## 2019-12-26 LAB — POC URINE PREG, ED: Preg Test, Ur: NEGATIVE

## 2019-12-26 MED ORDER — LORAZEPAM 1 MG PO TABS: 1.0000 mg | ORAL_TABLET | ORAL | Status: AC | PRN

## 2019-12-26 MED ORDER — ADULT MULTIVITAMIN W/MINERALS CH
1.0000 | ORAL_TABLET | Freq: Every day | ORAL | Status: DC
  Administered 2019-12-26 – 2019-12-31 (×6): 1 via ORAL
  Filled 2019-12-26 (×6): qty 1

## 2019-12-26 MED ORDER — POTASSIUM CHLORIDE IN NACL 20-0.9 MEQ/L-% IV SOLN: INTRAVENOUS | Status: DC

## 2019-12-26 MED ORDER — BISACODYL 5 MG PO TBEC: 5.0000 mg | DELAYED_RELEASE_TABLET | Freq: Every day | ORAL | Status: DC | PRN

## 2019-12-26 MED ORDER — ACETAMINOPHEN 650 MG RE SUPP: 650.0000 mg | Freq: Four times a day (QID) | RECTAL | Status: DC | PRN

## 2019-12-26 MED ORDER — FOLIC ACID 1 MG PO TABS
1.0000 mg | ORAL_TABLET | Freq: Every day | ORAL | Status: DC
  Administered 2019-12-26 – 2019-12-31 (×6): 1 mg via ORAL
  Filled 2019-12-26 (×6): qty 1

## 2019-12-26 MED ORDER — SODIUM CHLORIDE 0.9 % IV BOLUS
1000.0000 mL | Freq: Once | INTRAVENOUS | Status: AC
  Administered 2019-12-26: 15:00:00 1000 mL via INTRAVENOUS

## 2019-12-26 MED ORDER — MORPHINE SULFATE (PF) 4 MG/ML IV SOLN
4.0000 mg | Freq: Once | INTRAVENOUS | Status: AC
  Administered 2019-12-26: 15:00:00 4 mg via INTRAVENOUS
  Filled 2019-12-26: qty 1

## 2019-12-26 MED ORDER — SODIUM CHLORIDE 0.9% FLUSH
3.0000 mL | Freq: Once | INTRAVENOUS | Status: AC
  Administered 2019-12-26: 15:00:00 3 mL via INTRAVENOUS

## 2019-12-26 MED ORDER — ENOXAPARIN SODIUM 40 MG/0.4ML ~~LOC~~ SOLN
40.0000 mg | SUBCUTANEOUS | Status: DC
  Administered 2019-12-26: 21:00:00 40 mg via SUBCUTANEOUS
  Filled 2019-12-26 (×4): qty 0.4

## 2019-12-26 MED ORDER — LORAZEPAM 2 MG/ML IJ SOLN: 1.0000 mg | INTRAMUSCULAR | Status: AC | PRN

## 2019-12-26 MED ORDER — THIAMINE HCL 100 MG PO TABS
100.0000 mg | ORAL_TABLET | Freq: Every day | ORAL | Status: DC
  Administered 2019-12-26 – 2019-12-31 (×6): 100 mg via ORAL
  Filled 2019-12-26 (×6): qty 1

## 2019-12-26 MED ORDER — ONDANSETRON HCL 4 MG/2ML IJ SOLN
4.0000 mg | Freq: Four times a day (QID) | INTRAMUSCULAR | Status: DC | PRN
  Administered 2019-12-26 – 2019-12-27 (×2): 4 mg via INTRAVENOUS
  Filled 2019-12-26 (×2): qty 2

## 2019-12-26 MED ORDER — MORPHINE SULFATE (PF) 4 MG/ML IV SOLN
4.0000 mg | INTRAVENOUS | Status: DC | PRN
  Administered 2019-12-26 – 2019-12-30 (×7): 4 mg via INTRAVENOUS
  Filled 2019-12-26 (×7): qty 1

## 2019-12-26 MED ORDER — ONDANSETRON HCL 4 MG PO TABS: 4.0000 mg | ORAL_TABLET | Freq: Four times a day (QID) | ORAL | Status: DC | PRN

## 2019-12-26 MED ORDER — ONDANSETRON HCL 4 MG/2ML IJ SOLN
4.0000 mg | Freq: Once | INTRAMUSCULAR | Status: AC
  Administered 2019-12-26: 15:00:00 4 mg via INTRAVENOUS
  Filled 2019-12-26: qty 2

## 2019-12-26 MED ORDER — SENNOSIDES-DOCUSATE SODIUM 8.6-50 MG PO TABS: 1.0000 | ORAL_TABLET | Freq: Every evening | ORAL | Status: DC | PRN

## 2019-12-26 MED ORDER — ACETAMINOPHEN 325 MG PO TABS
650.0000 mg | ORAL_TABLET | Freq: Four times a day (QID) | ORAL | Status: DC | PRN
  Administered 2019-12-27 – 2019-12-30 (×6): 650 mg via ORAL
  Filled 2019-12-26 (×6): qty 2

## 2019-12-26 NOTE — H&P (Signed)
[facility] H&P    Patient Demographics:    Holly Joseph, is a 34 y.o. female  MRN: [id number]  DOB - June 06, 1986  Admit Date - 12/26/2019  Referring MD/NP/PA: Long  Outpatient Primary MD for the patient is Patient, No Pcp Per  Patient coming from: Home  Chief complaint-abdominal pain   HPI:    Holly Joseph  is a 34 y.o. female, with history of eclampsia, bacterial vaginosis, presents to the ER with abdominal pain.  Patient reports that the pain started yesterday, and has been getting progressively worse since.  It is epigastric and radiates to her left side and left lower quadrant.  Patient reports associated nausea.  She reports associated emesis that looks like phlegm, yellow, but no blood.  Her last normal bowel movement was this morning.  Her last normal meal was yesterday afternoon.  She has had no appetite today.  Pain is currently improved when compared to arrival.  Patient reports pain medication given in the ER helped to improve her pain.  She has never had pancreatitis before.  Patient's gallbladder is still intact.  She reports no postprandial nausea.  Patient does report that due to mental stressors she has been drinking more than normal.  Over the last 4 to 5 days she has been drinking a pint a day of alcohol.  Patient reports that she has never had withdrawal from alcohol.  Patient denies dysuria.  Patient admits to vaginal bleeding but reports that she is on her period.  Has negative urine pregnancy today.  She is not on birth control.  She is not on any blood thinners.  Patient has no other complaints at this time.  Temperature 97.9, heart rate 85, respiratory 24, blood pressure 180/96. Patient is afebrile with no leukocytosis. Lipase 1664 AST 101 ALT 54 Troponin 2 Chest x-ray shows nothing acute  EKG shows heart rate 67 with sinus rhythm Morphine given in the ED Zofran administered 1 L normal  saline bolus given     Review of systems:    In addition to the HPI above,  No Fever-chills, No Headache, No changes with Vision or hearing, No problems swallowing food or Liquids, No Chest pain, Cough or Shortness of Breath, No Blood in stool or Urine, No dysuria, No new skin rashes or bruises, No new joints pains-aches,  No new weakness, tingling, numbness in any extremity, No recent weight gain or loss, No polyuria, polydypsia or polyphagia, No significant Mental Stressors.  All other systems reviewed and are negative.    Past History of the following :    Past Medical History:  Diagnosis Date   BV (bacterial vaginosis)    Eclamptic seizure    Screening for STD (sexually transmitted disease) 12/05/2013      Past Surgical History:  Procedure Laterality Date   NO PAST SURGERIES     TUBAL LIGATION  06/28/2012   Procedure: POST PARTUM TUBAL LIGATION;  Surgeon: Tereso Newcomer, MD;  Location: [facility];  Service: Gynecology;  Laterality: Bilateral;  Post partum tubal ligation with filshie  clips      Social History:      Social History   Tobacco Use   Smoking status: Current Every Day Smoker    Packs/day: 0.50    Years: 10.00    Pack years: 5.00    Types: Cigarettes   Smokeless tobacco: Never Used  Substance Use Topics   Alcohol use: Yes    Comment: 1 pint of liqour per day       Family History :     Family History  Problem Relation Age of Onset   Stroke Mother    Diabetes Mother    Hypertension Mother    Cancer Mother        cervical    Diabetes Sister    Cancer Maternal Aunt    Other Neg Hx       Home Medications:   Prior to Admission medications   Not on File     Allergies:     Allergies  Allergen Reactions   Latex Swelling     Physical Exam:   Vitals  Blood pressure (!) 167/94, pulse (!) 58, temperature 98.4 F (36.9 C), temperature source Oral, resp. rate 16, height 5\' 4"  (1.626 m), weight 76.2 kg, last  menstrual period 12/22/2019, SpO2 100 %.  1.  General: Lying supine in bed, drowsy  2. Psychiatric: Cooperative with exam, mood is normal  3. Neurologic: Cranial nerves II through XII are grossly intact no focal deficit on limited exam  4. HEENMT:  EOMI Head is atraumatic normocephalic Neck is supple Trachea is midline Mucous membranes are moist  5. Respiratory : Lungs are clear to auscultation bilaterally  6. Cardiovascular : Heart rate is normal rhythm is regular  7. Gastrointestinal:  Abdomen is soft, nondistended, tender to palpation over the epigastrium and left upper quadrant Also tender to palpation in the left lower quadrant  8. Skin:  No acute lesions on limited skin exam  9.Musculoskeletal:  No acute deformities    Data Review:    CBC Recent Labs  Lab 12/26/19 1406  WBC 9.7  HGB 14.8  HCT 43.3  PLT 166  MCV 97.5  MCH 33.3  MCHC 34.2  RDW 14.2   ------------------------------------------------------------------------------------------------------------------  Results for orders placed or performed during the hospital encounter of 12/26/19 (from the past 48 hour(s))  Lipase, blood     Status: Abnormal   Collection Time: 12/26/19  2:06 PM  Result Value Ref Range   Lipase 1,664 (H) 11 - 51 U/L    Comment: RESULTS CONFIRMED BY MANUAL DILUTION Performed at [facility], [address], [place], [place]   Comprehensive metabolic panel     Status: Abnormal   Collection Time: 12/26/19  2:06 PM  Result Value Ref Range   Sodium 137 135 - 145 mmol/L   Potassium 3.3 (L) 3.5 - 5.1 mmol/L   Chloride 101 98 - 111 mmol/L   CO2 19 (L) 22 - 32 mmol/L   Glucose, Bld 127 (H) 70 - 99 mg/dL    Comment: Glucose reference range applies only to samples taken after fasting for at least 8 hours.   BUN 10 6 - 20 mg/dL   Creatinine, Ser 12/28/19 0.44 - 1.00 mg/dL   Calcium 9.2 8.9 - 7.89 mg/dL   Total Protein 7.2 6.5 - 8.1 g/dL   Albumin 4.3 3.5 - 5.0 g/dL    AST 38.1 (H) 15 - 41 U/L   ALT 54 (H) 0 - 44 U/L   Alkaline Phosphatase  67 38 - 126 U/L   Total Bilirubin 1.0 0.3 - 1.2 mg/dL   GFR calc non Af Amer >60 >60 mL/min   GFR calc Af Amer >60 >60 mL/min   Anion gap 17 (H) 5 - 15    Comment: Performed at [facility], [address] [place], [place] [address]  CBC     Status: None   Collection Time: 12/26/19  2:06 PM  Result Value Ref Range   WBC 9.7 4.0 - 10.5 K/uL   RBC 4.44 3.87 - 5.11 MIL/uL   Hemoglobin 14.8 12.0 - 15.0 g/dL   HCT 43.3 36.0 - 46.0 %   MCV 97.5 80.0 - 100.0 fL   MCH 33.3 26.0 - 34.0 pg   MCHC 34.2 30.0 - 36.0 g/dL   RDW 14.2 11.5 - 15.5 %   Platelets 166 150 - 400 K/uL   nRBC 0.0 0.0 - 0.2 %    Comment: Performed at [facility], [address], [place], [place] [address]  Troponin I (High Sensitivity)     Status: None   Collection Time: 12/26/19  2:06 PM  Result Value Ref Range   Troponin I (High Sensitivity) 2 <18 ng/L    Comment: (NOTE) Elevated high sensitivity troponin I (hsTnI) values and significant  changes across serial measurements may suggest ACS but many other  chronic and acute conditions are known to elevate hsTnI results.  Refer to the "Links" section for chest pain algorithms and additional  guidance. Performed at [facility], [address] [place], [place] [address]   Troponin I (High Sensitivity)     Status: None   Collection Time: 12/26/19  3:51 PM  Result Value Ref Range   Troponin I (High Sensitivity) 3 <18 ng/L    Comment: (NOTE) Elevated high sensitivity troponin I (hsTnI) values and significant  changes across serial measurements may suggest ACS but many other  chronic and acute conditions are known to elevate hsTnI results.  Refer to the "Links" section for chest pain algorithms and additional  guidance. Performed at [facility], [address], [place], [place] [address]   POC urine preg, ED     Status: None   Collection Time: 12/26/19  4:29 PM  Result Value Ref Range   Preg  Test, Ur NEGATIVE NEGATIVE    Comment:        THE SENSITIVITY OF THIS METHODOLOGY IS >24 mIU/mL   Urinalysis, Routine w reflex microscopic     Status: Abnormal   Collection Time: 12/26/19  4:30 PM  Result Value Ref Range   Color, Urine YELLOW YELLOW   APPearance HAZY (A) CLEAR   Specific Gravity, Urine 1.028 1.005 - 1.030   pH 6.0 5.0 - 8.0   Glucose, UA NEGATIVE NEGATIVE mg/dL   Hgb urine dipstick LARGE (A) NEGATIVE   Bilirubin Urine NEGATIVE NEGATIVE   Ketones, ur 80 (A) NEGATIVE mg/dL   Protein, ur 100 (A) NEGATIVE mg/dL   Nitrite NEGATIVE NEGATIVE   Leukocytes,Ua NEGATIVE NEGATIVE   RBC / HPF >50 (H) 0 - 5 RBC/hpf   WBC, UA 0-5 0 - 5 WBC/hpf   Bacteria, UA RARE (A) NONE SEEN   Squamous Epithelial / LPF 0-5 0 - 5   Mucus PRESENT     Comment: Performed at [facility], [address], [place], [place] [address]    Chemistries  Recent Labs  Lab 12/26/19 1406  NA 137  K 3.3*  CL 101  CO2 19*  GLUCOSE 127*  BUN 10  CREATININE 0.68  CALCIUM 9.2  AST 101*  ALT 54*  ALKPHOS 67  BILITOT 1.0   ------------------------------------------------------------------------------------------------------------------  ------------------------------------------------------------------------------------------------------------------ GFR: Estimated Creatinine Clearance: 100.9 mL/min (by C-G formula based on SCr of 0.68 mg/dL). Liver Function Tests: Recent Labs  Lab 12/26/19 1406  AST 101*  ALT 54*  ALKPHOS 67  BILITOT 1.0  PROT 7.2  ALBUMIN 4.3   Recent Labs  Lab 12/26/19 1406  LIPASE 1,664*   No results for input(s): AMMONIA in the last 168 hours. Coagulation Profile: No results for input(s): INR, PROTIME in the last 168 hours. Cardiac Enzymes: No results for input(s): CKTOTAL, CKMB, CKMBINDEX, TROPONINI in the last 168 hours. BNP (last 3 results) No results for input(s): PROBNP in the last 8760 hours. HbA1C: No results for input(s): HGBA1C in the last 72  hours. CBG: No results for input(s): GLUCAP in the last 168 hours. Lipid Profile: No results for input(s): CHOL, HDL, LDLCALC, TRIG, CHOLHDL, LDLDIRECT in the last 72 hours. Thyroid Function Tests: No results for input(s): TSH, T4TOTAL, FREET4, T3FREE, THYROIDAB in the last 72 hours. Anemia Panel: No results for input(s): VITAMINB12, FOLATE, FERRITIN, TIBC, IRON, RETICCTPCT in the last 72 hours.  --------------------------------------------------------------------------------------------------------------- Urine analysis:    Component Value Date/Time   COLORURINE YELLOW 12/26/2019 1630   APPEARANCEUR HAZY (A) 12/26/2019 1630   LABSPEC 1.028 12/26/2019 1630   PHURINE 6.0 12/26/2019 1630   GLUCOSEU NEGATIVE 12/26/2019 1630   HGBUR LARGE (A) 12/26/2019 1630   BILIRUBINUR NEGATIVE 12/26/2019 1630   KETONESUR 80 (A) 12/26/2019 1630   PROTEINUR 100 (A) 12/26/2019 1630   UROBILINOGEN 0.2 07/03/2010 0622   NITRITE NEGATIVE 12/26/2019 1630   LEUKOCYTESUR NEGATIVE 12/26/2019 1630      Imaging Results:    DG Chest Portable 1 View  Result Date: 12/26/2019 CLINICAL DATA:  Upper abdominal, back and chest pain since yesterday. EXAM: PORTABLE CHEST 1 VIEW COMPARISON:  PA and lateral chest 07/03/2010. FINDINGS: The lungs are clear. Heart size is normal. No pneumothorax or pleural fluid. No acute or focal bony abnormality. IMPRESSION: Negative chest. Electronically Signed   By: Drusilla Kannerhomas  Dalessio M.D.   On: 12/26/2019 14:16    My personal review of EKG: Rhythm NSR, Rate 67 /min, QTc 400 ,no Acute ST changes   Assessment & Plan:    Active Problems:   Pancreatitis   1. Pancreatitis 1. Epigastric pain radiating to left side 2. Lipase 1664 3. Patient has been drinking a pint of alcohol every day for the last 4 or 5 days 4. Likely alcohol induced pancreatitis 5. Ultrasound right upper quadrant to rule out biliary obstruction 6. Lipid panel to rule out hypertriglyceridemia as a cause of  pancreatitis 7. Patient is not taking any medications at home which could cause pancreatitis 8. Keep patient n.p.o. 9. Continue IV fluids 10. Continue pain control 11. Likely to be discharged after 2 midnights 2. Hypokalemia 1. Potassium 3.3 2. Add KCl 20 mEq to IV fluids 3. Trend in the a.m. 3. Hyperglycemia 1. No documented history of diabetes 2. Fasting glucose 127 3. Hemoglobin A1c pending 4. EtOH abuse 1. Patient has never had withdrawals from alcohol 2. CIWA protocol in place 5.    DVT Prophylaxis-   Lovenox - SCDs   AM Labs Ordered, also please review Full Orders  Family Communication: No family at bedside Code Status: Full  Admission status: Inpatient :The appropriate admission status for this patient is INPATIENT. Inpatient status is judged to be reasonable and necessary in  order to provide the required intensity of service to ensure the patient's safety. The patient's presenting symptoms, physical exam findings, and initial radiographic and laboratory data in the context of their chronic comorbidities is felt to place them at high risk for further clinical deterioration. Furthermore, it is not anticipated that the patient will be medically stable for discharge from the hospital within 2 midnights of admission. The following factors support the admission status of inpatient.     The patient's presenting symptoms include abdominal pain The worrisome physical exam findings include tenderness to palpation of abdomen. The initial radiographic and laboratory data are worrisome because of lipase 1664.         I certify that at the point of admission it is my clinical judgment that the patient will require inpatient hospital care spanning beyond 2 midnights from the point of admission due to high intensity of service, high risk for further deterioration and high frequency of surveillance required.  Time spent in minutes : 63   Marcile Fuquay B Zierle-Ghosh M.D

## 2019-12-26 NOTE — ED Provider Notes (Signed)
Emergency Department Provider Note   I have reviewed the triage vital signs and the nursing notes.   HISTORY  Chief Complaint Abdominal Pain   HPI Holly Joseph is a 34 y.o. female with PMH EtOH abuse with increased drinking recently presents to the emergency department for evaluation of epigastric and left upper quadrant abdominal pain radiating to the back.  Pain is severe with some radiation into the center of the chest as well.  She has had nausea and vomiting.  Patient drinks regularly states that she is going through a divorce and has been drinking significantly more in the past several days.  She denies other drug use.  She has not seen blood in her vomit.  No fevers or chills.  No lower abdominal pain or diarrhea.  No prior history of pancreatitis.    Past Medical History:  Diagnosis Date  . BV (bacterial vaginosis)   . Eclamptic seizure   . Screening for STD (sexually transmitted disease) 12/05/2013    Patient Active Problem List   Diagnosis Date Noted  . Screening for STD (sexually transmitted disease) 12/05/2013  . NSVD (normal spontaneous vaginal delivery) 06/28/2012  . Desires Sterilization 06/28/2012    Past Surgical History:  Procedure Laterality Date  . NO PAST SURGERIES    . TUBAL LIGATION  06/28/2012   Procedure: POST PARTUM TUBAL LIGATION;  Surgeon: Tereso Newcomer, MD;  Location: [facility];  Service: Gynecology;  Laterality: Bilateral;  Post partum tubal ligation with filshie clips    Allergies Latex  Family History  Problem Relation Age of Onset  . Stroke Mother   . Diabetes Mother   . Hypertension Mother   . Cancer Mother        cervical   . Diabetes Sister   . Cancer Maternal Aunt   . Other Neg Hx     Social History Social History   Tobacco Use  . Smoking status: Current Every Day Smoker    Packs/day: 0.50    Years: 10.00    Pack years: 5.00    Types: Cigarettes  . Smokeless tobacco: Never Used  Substance Use Topics  . Alcohol  use: Yes    Comment: 1 pint of liqour per day  . Drug use: Yes    Types: Marijuana    Review of Systems  Constitutional: No fever/chills Eyes: No visual changes. ENT: No sore throat. Cardiovascular: Denies chest pain. Respiratory: Denies shortness of breath. Gastrointestinal: Positive abdominal pain. Positive nausea and vomiting.  No diarrhea.  No constipation. Genitourinary: Negative for dysuria. Musculoskeletal: Negative for back pain. Skin: Negative for rash. Neurological: Negative for headaches.   10-point ROS otherwise negative.  ____________________________________________   PHYSICAL EXAM:  VITAL SIGNS: ED Triage Vitals [12/26/19 1340]  Enc Vitals Group     BP (!) 154/101     Pulse Rate 85     Resp 18     Temp 97.9 F (36.6 C)     Temp Source Oral     SpO2 100 %     Weight 170 lb (77.1 kg)     Height 5\' 4"  (1.626 m)   Constitutional: Alert and oriented. Well appearing and in no acute distress. Eyes: Conjunctivae are normal.  Head: Atraumatic. Nose: No congestion/rhinnorhea. Mouth/Throat: Mucous membranes are moist.   Neck: No stridor.  Cardiovascular: Normal rate, regular rhythm. Good peripheral circulation. Grossly normal heart sounds.   Respiratory: Normal respiratory effort.  No retractions. Lungs CTAB. Gastrointestinal: Soft with mild diffuse tenderness.  No rebound or guarding. No distention.  Musculoskeletal: No gross deformities of extremities. Neurologic:  Normal speech and language. Skin:  Skin is warm, dry and intact. No rash noted.   ____________________________________________   LABS (all labs ordered are listed, but only abnormal results are displayed)  Labs Reviewed  LIPASE, BLOOD - Abnormal; Notable for the following components:      Result Value   Lipase 1,664 (*)    All other components within normal limits  COMPREHENSIVE METABOLIC PANEL - Abnormal; Notable for the following components:   Potassium 3.3 (*)    CO2 19 (*)     Glucose, Bld 127 (*)    AST 101 (*)    ALT 54 (*)    Anion gap 17 (*)    All other components within normal limits  CBC  URINALYSIS, ROUTINE W REFLEX MICROSCOPIC  POC URINE PREG, ED  TROPONIN I (HIGH SENSITIVITY)  TROPONIN I (HIGH SENSITIVITY)   ____________________________________________  EKG   EKG Interpretation  Date/Time:  Monday December 26 2019 13:47:41 EDT Ventricular Rate:  67 PR Interval:    QRS Duration: 88 QT Interval:  379 QTC Calculation: 400 R Axis:   52 Text Interpretation: Sinus rhythm Consider left atrial enlargement Baseline wander in lead(s) III V1 No STEMI Confirmed by Nanda Quinton [phone]) on 12/26/2019 2:01:57 PM      ____________________________________________  RADIOLOGY  DG Chest Portable 1 View  Result Date: 12/26/2019 CLINICAL DATA:  Upper abdominal, back and chest pain since yesterday. EXAM: PORTABLE CHEST 1 VIEW COMPARISON:  PA and lateral chest 07/03/2010. FINDINGS: The lungs are clear. Heart size is normal. No pneumothorax or pleural fluid. No acute or focal bony abnormality. IMPRESSION: Negative chest. Electronically Signed   By: Inge Rise M.D.   On: 12/26/2019 14:16    ____________________________________________   PROCEDURES  Procedure(s) performed:   Procedures  None  ____________________________________________   INITIAL IMPRESSION / ASSESSMENT AND PLAN / ED COURSE  Pertinent labs & imaging results that were available during my care of the patient were reviewed by me and considered in my medical decision making (see chart for details).   Patient presents to the ED with abdominal pain and vomiting. Suspect pancreatitis clinically. Will send troponin and CXR.   Lipase elevated. Suspect uncomplicated pancreatitis. Plan for admit.   Discussed patient's case with [facility] to request admission. Patient and family (if present) updated with plan. Care transferred to [facility] service.  I reviewed all nursing notes, vitals, pertinent old  records, EKGs, labs, imaging (as available).  ____________________________________________  FINAL CLINICAL IMPRESSION(S) / ED DIAGNOSES  Final diagnoses:  Alcohol-induced acute pancreatitis, unspecified complication status     MEDICATIONS GIVEN DURING THIS VISIT:  Medications  sodium chloride flush (NS) 0.9 % injection 3 mL (3 mLs Intravenous Given 12/26/19 1444)  sodium chloride 0.9 % bolus 1,000 mL (1,000 mLs Intravenous New Bag/Given 12/26/19 1445)  morphine 4 MG/ML injection 4 mg (4 mg Intravenous Given 12/26/19 1446)  ondansetron (ZOFRAN) injection 4 mg (4 mg Intravenous Given 12/26/19 1445)    Note:  This document was prepared using Dragon voice recognition software and may include unintentional dictation errors.  Nanda Quinton, MD, [facility] Emergency Medicine    , [facility], MD 12/27/19 1357

## 2019-12-26 NOTE — ED Triage Notes (Signed)
Pt reports upper abdominal pain, upper back pain and chest pain. Pain started yesterday and has gotten worse. Pt reports pain worse in abdomen and has been vomiting

## 2019-12-27 ENCOUNTER — Inpatient Hospital Stay (HOSPITAL_COMMUNITY): Payer: Self-pay

## 2019-12-27 DIAGNOSIS — F1010 Alcohol abuse, uncomplicated: Secondary | ICD-10-CM | POA: Diagnosis present

## 2019-12-27 DIAGNOSIS — Z720 Tobacco use: Secondary | ICD-10-CM | POA: Diagnosis present

## 2019-12-27 LAB — CBC WITH DIFFERENTIAL/PLATELET
Abs Immature Granulocytes: 0.02 10*3/uL (ref 0.00–0.07)
Basophils Absolute: 0 10*3/uL (ref 0.0–0.1)
Basophils Relative: 0 %
Eosinophils Absolute: 0 10*3/uL (ref 0.0–0.5)
Eosinophils Relative: 0 %
HCT: 44 % (ref 36.0–46.0)
Hemoglobin: 14.6 g/dL (ref 12.0–15.0)
Immature Granulocytes: 0 %
Lymphocytes Relative: 9 %
Lymphs Abs: 0.8 10*3/uL (ref 0.7–4.0)
MCH: 32.8 pg (ref 26.0–34.0)
MCHC: 33.2 g/dL (ref 30.0–36.0)
MCV: 98.9 fL (ref 80.0–100.0)
Monocytes Absolute: 0.4 10*3/uL (ref 0.1–1.0)
Monocytes Relative: 5 %
Neutro Abs: 7.5 10*3/uL (ref 1.7–7.7)
Neutrophils Relative %: 86 %
Platelets: 141 10*3/uL — ABNORMAL LOW (ref 150–400)
RBC: 4.45 MIL/uL (ref 3.87–5.11)
RDW: 14.3 % (ref 11.5–15.5)
WBC: 8.7 10*3/uL (ref 4.0–10.5)
nRBC: 0 % (ref 0.0–0.2)

## 2019-12-27 LAB — COMPREHENSIVE METABOLIC PANEL
ALT: 35 U/L (ref 0–44)
AST: 48 U/L — ABNORMAL HIGH (ref 15–41)
Albumin: 3.6 g/dL (ref 3.5–5.0)
Alkaline Phosphatase: 54 U/L (ref 38–126)
Anion gap: 13 (ref 5–15)
BUN: 12 mg/dL (ref 6–20)
CO2: 20 mmol/L — ABNORMAL LOW (ref 22–32)
Calcium: 8.4 mg/dL — ABNORMAL LOW (ref 8.9–10.3)
Chloride: 106 mmol/L (ref 98–111)
Creatinine, Ser: 0.62 mg/dL (ref 0.44–1.00)
GFR calc Af Amer: >60 mL/min (ref >60)
GFR calc non Af Amer: >60 mL/min (ref >60)
Glucose, Bld: 135 mg/dL — ABNORMAL HIGH (ref 70–99)
Potassium: 3.8 mmol/L (ref 3.5–5.1)
Sodium: 139 mmol/L (ref 135–145)
Total Bilirubin: 1.3 mg/dL — ABNORMAL HIGH (ref 0.3–1.2)
Total Protein: 6.2 g/dL — ABNORMAL LOW (ref 6.5–8.1)

## 2019-12-27 LAB — LIPID PANEL
Cholesterol: 233 mg/dL — ABNORMAL HIGH (ref 0–200)
HDL: 85 mg/dL (ref >40)
LDL Cholesterol: 122 mg/dL — ABNORMAL HIGH (ref 0–99)
Total CHOL/HDL Ratio: 2.7 RATIO
Triglycerides: 130 mg/dL (ref <150)
VLDL: 26 mg/dL (ref 0–40)

## 2019-12-27 LAB — PHOSPHORUS: Phosphorus: 2.5 mg/dL (ref 2.5–4.6)

## 2019-12-27 LAB — HEMOGLOBIN A1C
Hgb A1c MFr Bld: 5 % (ref 4.8–5.6)
Mean Plasma Glucose: 96.8 mg/dL

## 2019-12-27 LAB — GLUCOSE, CAPILLARY: Glucose-Capillary: 112 mg/dL — ABNORMAL HIGH (ref 70–99)

## 2019-12-27 LAB — HIV ANTIBODY (ROUTINE TESTING W REFLEX): HIV Screen 4th Generation wRfx: NONREACTIVE

## 2019-12-27 LAB — MAGNESIUM: Magnesium: 1.3 mg/dL — ABNORMAL LOW (ref 1.7–2.4)

## 2019-12-27 MED ORDER — PANTOPRAZOLE SODIUM 40 MG PO TBEC
40.0000 mg | DELAYED_RELEASE_TABLET | Freq: Every day | ORAL | Status: DC
  Administered 2019-12-27 – 2019-12-31 (×5): 40 mg via ORAL
  Filled 2019-12-27 (×6): qty 1

## 2019-12-27 MED ORDER — SODIUM CHLORIDE 0.9 % IV SOLN: INTRAVENOUS | Status: DC

## 2019-12-27 MED ORDER — LORAZEPAM 2 MG/ML IJ SOLN
1.0000 mg | Freq: Once | INTRAMUSCULAR | Status: AC
  Administered 2019-12-27: 22:00:00 1 mg via INTRAVENOUS
  Filled 2019-12-27: qty 1

## 2019-12-27 MED ORDER — MAGNESIUM SULFATE 4 GM/100ML IV SOLN
4.0000 g | Freq: Once | INTRAVENOUS | Status: AC
  Administered 2019-12-27: 4 g via INTRAVENOUS
  Filled 2019-12-27: qty 100

## 2019-12-27 MED ORDER — TRAZODONE HCL 50 MG PO TABS
100.0000 mg | ORAL_TABLET | Freq: Every day | ORAL | Status: DC
  Administered 2019-12-28 – 2019-12-30 (×3): 100 mg via ORAL
  Filled 2019-12-27 (×3): qty 2

## 2019-12-27 MED ORDER — NICOTINE 14 MG/24HR TD PT24
14.0000 mg | MEDICATED_PATCH | Freq: Every day | TRANSDERMAL | Status: DC
  Filled 2019-12-27 (×4): qty 1

## 2019-12-27 NOTE — Progress Notes (Signed)
Patient Demographics:    Holly Joseph, is a 34 y.o. female, DOB - 08-25-1986, FMB:[id number]  Admit date - 12/26/2019   Admitting Physician Lilyan Gilford, DO  Outpatient Primary MD for the patient is Patient, No Pcp Per  LOS - 1   Chief Complaint  Patient presents with  . Abdominal Pain        Subjective:    Holly Joseph today has no fevers,  --Nausea persist, no Further emesis,  No chest pain,   -Abdominal pain persist  Assessment  & Plan :    Principal Problem:   Pancreatitis Active Problems:   ETOH abuse   Nicotine abuse  Brief summary -34 year old with nicotine alcohol abuse admitted on 12/26/2019 with abdominal pain nausea vomiting consistent with acute alcoholic pancreatitis   A/p 1) acute alcoholic pancreatitis--- abdominal pain nausea persist continue n.p.o. status, continue IV fluids continue Protonix, as needed morphine sulfate --We will consider liquid diet as nausea and abdominal pain ACS -A1c 5.0 -Fasting lipid profile with triglycerides of 130 HDL of 85 LDL of 122 total cholesterol of 233 -Right upper quadrant ultrasound without gallstones small amount of ascites related to acute pancreatitis noted ??  Some degree of hepatic steatosis  2) alcohol abuse--- patient admits to drinking a lot more heavily than usual for her due to going through divorce currently--- continue multivitamin/thiamine/folic acid and lorazepam per CIWA protocol   3) tobacco abuse--cessation advised continue nicotine patch  4) hypomagnesemia in the setting of alcohol abuse-and emesis--- replaced  Disposition/Need for in-Hospital Stay- patient unable to be discharged at this time due to ---n.p.o. status requiring IV fluids and IV opiates -Patient From: home D/C Place: home Barriers: Not Clinically Stable-   Code Status : full  Family Communication:   NA (patient is alert, awake and  coherent)  Consults  :  na  DVT Prophylaxis  :  Lovenox -  SCDs *  Lab Results  Component Value Date   PLT 141 (L) 12/27/2019    Inpatient Medications  Scheduled Meds: . enoxaparin (LOVENOX) injection  40 mg Subcutaneous Q24H  . folic acid  1 mg Oral Daily  . multivitamin with minerals  1 tablet Oral Daily  . pantoprazole  40 mg Oral Daily  . thiamine  100 mg Oral Daily  . traZODone  100 mg Oral QHS   Continuous Infusions: . sodium chloride 150 mL/hr at 12/27/19 1032   PRN Meds:.acetaminophen **OR** acetaminophen, bisacodyl, LORazepam **OR** LORazepam, morphine injection, ondansetron **OR** ondansetron (ZOFRAN) IV, senna-docusate    Anti-infectives (From admission, onward)   None        Objective:   Vitals:   12/27/19 0024 12/27/19 0518 12/27/19 0854 12/27/19 1600  BP: (!) 159/98 (!) 152/97 (!) 143/100 (!) 138/98  Pulse: 71 97 (!) 111 (!) 103  Resp: 20 16 18 20   Temp: 98 F (36.7 C) 98.4 F (36.9 C) 98.3 F (36.8 C) 98.2 F (36.8 C)  TempSrc: Oral Oral  Oral  SpO2: 99% 98% 98% 100%  Weight:      Height:        Wt Readings from Last 3 Encounters:  12/26/19 76.2 kg  09/12/19 70.8 kg  04/11/17 68 kg     Intake/Output Summary (Last 24  hours) at 12/27/2019 1909 Last data filed at 12/27/2019 1300 Gross per 24 hour  Intake 700 ml  Output 201 ml  Net 499 ml     Physical Exam  Gen:- Awake Alert, resting HEENT:- [place].AT, No sclera icterus Neck-Supple Neck,No JVD,.  Lungs-  CTAB , fair symmetrical air movement CV- S1, S2 normal, regular Abd-  +ve B.Sounds, Abd Soft, epigastric and periumbilical tenderness,   without rebound or guarding Extremity/Skin:- No  edema, pedal pulses present  Psych-affect is appropriate, oriented x3 Neuro-no new focal deficits, no tremors   Data Review:   Micro Results Recent Results (from the past 240 hour(s))  SARS CORONAVIRUS 2 (TAT 6-24 HRS) Nasopharyngeal Nasopharyngeal Swab     Status: None   Collection Time:  12/26/19  6:15 PM   Specimen: Nasopharyngeal Swab  Result Value Ref Range Status   SARS Coronavirus 2 NEGATIVE NEGATIVE Final    Comment: (NOTE) SARS-CoV-2 target nucleic acids are NOT DETECTED. The SARS-CoV-2 RNA is generally detectable in upper and lower respiratory specimens during the acute phase of infection. Negative results do not preclude SARS-CoV-2 infection, do not rule out co-infections with other pathogens, and should not be used as the sole basis for treatment or other patient management decisions. Negative results must be combined with clinical observations, patient history, and epidemiological information. The expected result is Negative. Fact Sheet for Patients: HairSlick.no Fact Sheet for Healthcare Providers: quierodirigir.com This test is not yet approved or cleared by the [place] FDA and  has been authorized for detection and/or diagnosis of SARS-CoV-2 by FDA under an Emergency Use Authorization (EUA). This EUA will remain  in effect (meaning this test can be used) for the duration of the COVID-19 declaration under Section 56 4(b)(1) of the Act, 21 U.S.C. section 360bbb-3(b)(1), unless the authorization is terminated or revoked sooner. Performed at [facility], [address], [place], [place] [address]     Radiology Reports DG Chest Portable 1 View  Result Date: 12/26/2019 CLINICAL DATA:  Upper abdominal, back and chest pain since yesterday. EXAM: PORTABLE CHEST 1 VIEW COMPARISON:  PA and lateral chest 07/03/2010. FINDINGS: The lungs are clear. Heart size is normal. No pneumothorax or pleural fluid. No acute or focal bony abnormality. IMPRESSION: Negative chest. Electronically Signed   By: Drusilla Kanner M.D.   On: 12/26/2019 14:16   US Abdomen Limited RUQ  Result Date: 12/27/2019 CLINICAL DATA:  Pancreatitis. EXAM: ULTRASOUND ABDOMEN LIMITED RIGHT UPPER QUADRANT COMPARISON:  No pertinent  prior studies available for comparison. FINDINGS: Gallbladder: No gallstones or wall thickening visualized. No sonographic Murphy sign noted by sonographer. Common bile duct: Diameter: 5-6 mm, within normal limits Liver: No focal lesion identified. Generalized increased hepatic parenchymal echogenicity. Portal vein is patent on color Doppler imaging with normal direction of blood flow towards the liver. IMPRESSION: No gallstones are identified. The visualized common duct is normal in caliber. Generalized increased hepatic parenchymal echogenicity. This is a nonspecific finding, which may be seen in the setting of hepatic steatosis or other chronic hepatic parenchymal disease. Right upper quadrant ascites, possibly related to reported pancreatitis. Electronically Signed   By: Jackey Loge DO   On: 12/27/2019 09:14     CBC Recent Labs  Lab 12/26/19 1406 12/27/19 0554  WBC 9.7 8.7  HGB 14.8 14.6  HCT 43.3 44.0  PLT 166 141*  MCV 97.5 98.9  MCH 33.3 32.8  MCHC 34.2 33.2  RDW 14.2 14.3  LYMPHSABS  --  0.8  MONOABS  --  0.4  EOSABS  --  0.0  BASOSABS  --  0.0    Chemistries  Recent Labs  Lab 12/26/19 1406 12/27/19 0554  NA 137 139  K 3.3* 3.8  CL 101 106  CO2 19* 20*  GLUCOSE 127* 135*  BUN 10 12  CREATININE 0.68 0.62  CALCIUM 9.2 8.4*  MG  --  1.3*  AST 101* 48*  ALT 54* 35  ALKPHOS 67 54  BILITOT 1.0 1.3*   ------------------------------------------------------------------------------------------------------------------ Recent Labs    12/27/19 0554  CHOL 233*  HDL 85  LDLCALC 122*  TRIG 130  CHOLHDL 2.7    Lab Results  Component Value Date   HGBA1C 5.0 12/27/2019   ------------------------------------------------------------------------------------------------------------------ No results for input(s): TSH, T4TOTAL, T3FREE, THYROIDAB in the last 72 hours.  Invalid input(s):  FREET3 ------------------------------------------------------------------------------------------------------------------ No results for input(s): VITAMINB12, FOLATE, FERRITIN, TIBC, IRON, RETICCTPCT in the last 72 hours.  Coagulation profile No results for input(s): INR, PROTIME in the last 168 hours.  No results for input(s): DDIMER in the last 72 hours.  Cardiac Enzymes No results for input(s): CKMB, TROPONINI, MYOGLOBIN in the last 168 hours.  Invalid input(s): CK ------------------------------------------------------------------------------------------------------------------ No results found for: BNP   Roxan Hockey M.D on 12/27/2019 at 7:09 PM  Go to [email or web address] - for contact info  [facility] - Office  [phone]

## 2019-12-28 ENCOUNTER — Inpatient Hospital Stay (HOSPITAL_COMMUNITY): Payer: Self-pay

## 2019-12-28 DIAGNOSIS — Z720 Tobacco use: Secondary | ICD-10-CM

## 2019-12-28 LAB — RENAL FUNCTION PANEL
Albumin: 2.9 g/dL — ABNORMAL LOW (ref 3.5–5.0)
Anion gap: 10 (ref 5–15)
BUN: 9 mg/dL (ref 6–20)
CO2: 22 mmol/L (ref 22–32)
Calcium: 7.6 mg/dL — ABNORMAL LOW (ref 8.9–10.3)
Chloride: 105 mmol/L (ref 98–111)
Creatinine, Ser: 0.52 mg/dL (ref 0.44–1.00)
GFR calc Af Amer: >60 mL/min (ref >60)
GFR calc non Af Amer: >60 mL/min (ref >60)
Glucose, Bld: 103 mg/dL — ABNORMAL HIGH (ref 70–99)
Phosphorus: 1.2 mg/dL — ABNORMAL LOW (ref 2.5–4.6)
Potassium: 3.4 mmol/L — ABNORMAL LOW (ref 3.5–5.1)
Sodium: 137 mmol/L (ref 135–145)

## 2019-12-28 LAB — GLUCOSE, CAPILLARY
Glucose-Capillary: 92 mg/dL (ref 70–99)
Glucose-Capillary: 89 mg/dL (ref 70–99)
Glucose-Capillary: 86 mg/dL (ref 70–99)

## 2019-12-28 LAB — MAGNESIUM: Magnesium: 2.2 mg/dL (ref 1.7–2.4)

## 2019-12-28 LAB — LIPASE, BLOOD: Lipase: 1067 U/L — ABNORMAL HIGH (ref 11–51)

## 2019-12-28 MED ORDER — POTASSIUM & SODIUM PHOSPHATES 280-160-250 MG PO PACK
2.0000 | PACK | Freq: Once | ORAL | Status: AC
  Administered 2019-12-28: 2 via ORAL
  Filled 2019-12-28: qty 2

## 2019-12-28 MED ORDER — SODIUM PHOSPHATES 45 MMOLE/15ML IV SOLN: 10.0000 mmol | Freq: Once | INTRAVENOUS | Status: DC

## 2019-12-28 MED ORDER — METOPROLOL TARTRATE 5 MG/5ML IV SOLN: 5.0000 mg | INTRAVENOUS | Status: DC | PRN

## 2019-12-28 MED ORDER — IOHEXOL 300 MG/ML  SOLN
100.0000 mL | Freq: Once | INTRAMUSCULAR | Status: AC | PRN
  Administered 2019-12-28: 100 mL via INTRAVENOUS

## 2019-12-28 NOTE — Progress Notes (Signed)
Patient has a round of tachycardia upon going to the bathroom.  Vitals stable.  Patient is admitted with pancreatitis and has elevated heart rate due to withdrawals.  Notified mid level and received order for prn medication.

## 2019-12-28 NOTE — Progress Notes (Signed)
T 101.6 - Given PRN tylenol- Will recheck in an hour. VS otherwise stable. MD notified, new orders placed.

## 2019-12-28 NOTE — Progress Notes (Signed)
PROGRESS NOTE Holly Joseph   Holly Joseph  RKY:[id number]  DOB: Dec 28, 1985  DOA: 12/26/2019 PCP: Patient, No Pcp Per   Brief Admission Hx: 34 year old female alcohol abuser presented with abdominal pain and noted to have acute pancreatitis.  MDM/Assessment & Plan:   1. Acute pancreatitis-suspected secondary to alcohol abuse and binge drinking-patient reports abdominal pain seems to be improving and she is having resolved nausea and no emesis at this time.  She would like to try clear liquids.  I will order to start clear liquid diet.  Her lipid panel was reviewed with normal triglycerides.  Her total cholesterol is elevated at 233 and LDL is suboptimally controlled at 122. 2. Chronic alcohol abuse-patient reports recent binge drinking continue CIWA protocol multivitamins and follow closely.  No signs of acute withdrawal at this time. 3. Tobacco abuse-nicotine patch offered in the hospital. 4. Hypomagnesemia-IV replacement ordered recheck in a.m.  DVT prophylaxis: lovenox Code Status: Full  Family Communication: patient updated with care plan at bedside, verbalizes understanding  Disposition Plan: continue inpatient management with IV fluids and supportive therapy for severe pancreatitis slow trial of clear liquid diet today.  Consultants:    Procedures:    Antimicrobials:     Subjective: Pt reports symptoms slightly improved, abdominal pain less intense and no nausea or emesis at this time.  Would like to try clear liquid diet.  Objective: Vitals:   12/28/19 0200 12/28/19 0545 12/28/19 0824 12/28/19 1000  BP: 120/90 135/89  136/90  Pulse: (!) 112 (!) 106  (!) 103  Resp: 18 15    Temp: 98 F (36.7 C) 99.2 F (37.3 C)    TempSrc: Oral Oral    SpO2: 100% 97% 96%   Weight:      Height:        Intake/Output Summary (Last 24 hours) at 12/28/2019 1638 Last data filed at 12/28/2019 0400 Gross per 24 hour  Intake 2201.97 ml  Output --  Net 2201.97 ml    Filed Weights   12/26/19 1340 12/26/19 1955  Weight: 77.1 kg 76.2 kg   REVIEW OF SYSTEMS  As per history otherwise all reviewed and reported negative  Exam:  General exam: Awake, alert, no apparent distress, cooperative. Respiratory system: Clear. No increased work of breathing. Cardiovascular system: S1 & S2 heard. No JVD, murmurs, gallops, clicks or pedal edema. Gastrointestinal system: Abdomen is nondistended, soft and mild epigastric tenderness with deep palpation with no rebound tenderness. Normal bowel sounds heard. Central nervous system: Alert and oriented. No focal neurological deficits. Extremities: no CCE.  Data Reviewed: Basic Metabolic Panel: Recent Labs  Lab 12/26/19 1406 12/27/19 0554 12/28/19 0458  NA 137 139 137  K 3.3* 3.8 3.4*  CL 101 106 105  CO2 19* 20* 22  GLUCOSE 127* 135* 103*  BUN 10 12 9   CREATININE 0.68 0.62 0.52  CALCIUM 9.2 8.4* 7.6*  MG  --  1.3* 2.2  PHOS  --  2.5 1.2*   Liver Function Tests: Recent Labs  Lab 12/26/19 1406 12/27/19 0554 12/28/19 0458  AST 101* 48*  --   ALT 54* 35  --   ALKPHOS 67 54  --   BILITOT 1.0 1.3*  --   PROT 7.2 6.2*  --   ALBUMIN 4.3 3.6 2.9*   Recent Labs  Lab 12/26/19 1406 12/28/19 0458  LIPASE 1,664* 1,067*   No results for input(s): AMMONIA in the last 168 hours. CBC: Recent Labs  Lab 12/26/19 1406 12/27/19 0554  WBC 9.7 8.7  NEUTROABS  --  7.5  HGB 14.8 14.6  HCT 43.3 44.0  MCV 97.5 98.9  PLT 166 141*   Cardiac Enzymes: No results for input(s): CKTOTAL, CKMB, CKMBINDEX, TROPONINI in the last 168 hours. CBG (last 3)  Recent Labs    12/28/19 0638 12/28/19 0744 12/28/19 1119  GLUCAP 92 89 86   Recent Results (from the past 240 hour(s))  SARS CORONAVIRUS 2 (TAT 6-24 HRS) Nasopharyngeal Nasopharyngeal Swab     Status: None   Collection Time: 12/26/19  6:15 PM   Specimen: Nasopharyngeal Swab  Result Value Ref Range Status   SARS Coronavirus 2 NEGATIVE NEGATIVE Final     Comment: (NOTE) SARS-CoV-2 target nucleic acids are NOT DETECTED. The SARS-CoV-2 RNA is generally detectable in upper and lower respiratory specimens during the acute phase of infection. Negative results do not preclude SARS-CoV-2 infection, do not rule out co-infections with other pathogens, and should not be used as the sole basis for treatment or other patient management decisions. Negative results must be combined with clinical observations, patient history, and epidemiological information. The expected result is Negative. Fact Sheet for Patients: SugarRoll.be Fact Sheet for Healthcare Providers: https://www.woods-mathews.com/ This test is not yet approved or cleared by the [place] FDA and  has been authorized for detection and/or diagnosis of SARS-CoV-2 by FDA under an Emergency Use Authorization (EUA). This EUA will remain  in effect (meaning this test can be used) for the duration of the COVID-19 declaration under Section 56 4(b)(1) of the Act, 21 U.S.C. section 360bbb-3(b)(1), unless the authorization is terminated or revoked sooner. Performed at [facility], [place] [address] [place], [place] [address]      Studies: US Abdomen Limited RUQ  Result Date: 12/27/2019 CLINICAL DATA:  Pancreatitis. EXAM: ULTRASOUND ABDOMEN LIMITED RIGHT UPPER QUADRANT COMPARISON:  No pertinent prior studies available for comparison. FINDINGS: Gallbladder: No gallstones or wall thickening visualized. No sonographic Murphy sign noted by sonographer. Common bile duct: Diameter: 5-6 mm, within normal limits Liver: No focal lesion identified. Generalized increased hepatic parenchymal echogenicity. Portal vein is patent on color Doppler imaging with normal direction of blood flow towards the liver. IMPRESSION: No gallstones are identified. The visualized common duct is normal in caliber. Generalized increased hepatic parenchymal echogenicity. This is a  nonspecific finding, which may be seen in the setting of hepatic steatosis or other chronic hepatic parenchymal disease. Right upper quadrant ascites, possibly related to reported pancreatitis. Electronically Signed   By: Kellie Simmering DO   On: 12/27/2019 09:14   Scheduled Meds: . enoxaparin (LOVENOX) injection  40 mg Subcutaneous Q24H  . folic acid  1 mg Oral Daily  . multivitamin with minerals  1 tablet Oral Daily  . nicotine  14 mg Transdermal Daily  . pantoprazole  40 mg Oral Daily  . thiamine  100 mg Oral Daily  . traZODone  100 mg Oral QHS   Continuous Infusions: . sodium chloride 150 mL/hr at 12/28/19 1556    Principal Problem:   Pancreatitis Active Problems:   ETOH abuse   Nicotine abuse  Time spent:   Irwin Brakeman, MD [facility] 12/28/2019, 4:38 PM    LOS: 2 days  How to contact the [facility] Attending or Consulting provider [place] or covering provider during after hours [place], for this patient?  1. Check the care team in [facility] and look for a) attending/consulting [facility] provider listed and b) the [facility] team listed 2. Log into [email or web address]  and use Ewa Gentry's universal password to access. If you do not have the password, please contact the hospital operator. 3. Locate the [facility] provider you are looking for under [facility] and page to a number that you can be directly reached. 4. If you still have difficulty reaching the provider, please page the [facility] (Director on Call) for the Hospitalists listed on amion for assistance.

## 2019-12-29 ENCOUNTER — Encounter (HOSPITAL_COMMUNITY): Payer: Self-pay | Admitting: Family Medicine

## 2019-12-29 ENCOUNTER — Telehealth: Payer: Self-pay | Admitting: Gastroenterology

## 2019-12-29 DIAGNOSIS — K8521 Alcohol induced acute pancreatitis with uninfected necrosis: Principal | ICD-10-CM

## 2019-12-29 LAB — CBC WITH DIFFERENTIAL/PLATELET
Abs Immature Granulocytes: 0.06 10*3/uL (ref 0.00–0.07)
Basophils Absolute: 0 10*3/uL (ref 0.0–0.1)
Basophils Relative: 0 %
Eosinophils Absolute: 0 10*3/uL (ref 0.0–0.5)
Eosinophils Relative: 0 %
HCT: 32.2 % — ABNORMAL LOW (ref 36.0–46.0)
Hemoglobin: 10.7 g/dL — ABNORMAL LOW (ref 12.0–15.0)
Immature Granulocytes: 1 %
Lymphocytes Relative: 10 %
Lymphs Abs: 0.8 10*3/uL (ref 0.7–4.0)
MCH: 33.3 pg (ref 26.0–34.0)
MCHC: 33.2 g/dL (ref 30.0–36.0)
MCV: 100.3 fL — ABNORMAL HIGH (ref 80.0–100.0)
Monocytes Absolute: 0.7 10*3/uL (ref 0.1–1.0)
Monocytes Relative: 8 %
Neutro Abs: 6.8 10*3/uL (ref 1.7–7.7)
Neutrophils Relative %: 81 %
Platelets: 105 10*3/uL — ABNORMAL LOW (ref 150–400)
RBC: 3.21 MIL/uL — ABNORMAL LOW (ref 3.87–5.11)
RDW: 13.8 % (ref 11.5–15.5)
WBC: 8.4 10*3/uL (ref 4.0–10.5)
nRBC: 0 % (ref 0.0–0.2)

## 2019-12-29 LAB — COMPREHENSIVE METABOLIC PANEL
ALT: 17 U/L (ref 0–44)
AST: 24 U/L (ref 15–41)
Albumin: 2.6 g/dL — ABNORMAL LOW (ref 3.5–5.0)
Alkaline Phosphatase: 49 U/L (ref 38–126)
Anion gap: 10 (ref 5–15)
BUN: <5 mg/dL — ABNORMAL LOW (ref 6–20)
CO2: 22 mmol/L (ref 22–32)
Calcium: 7.7 mg/dL — ABNORMAL LOW (ref 8.9–10.3)
Chloride: 102 mmol/L (ref 98–111)
Creatinine, Ser: 0.55 mg/dL (ref 0.44–1.00)
GFR calc Af Amer: >60 mL/min (ref >60)
GFR calc non Af Amer: >60 mL/min (ref >60)
Glucose, Bld: 96 mg/dL (ref 70–99)
Potassium: 3 mmol/L — ABNORMAL LOW (ref 3.5–5.1)
Sodium: 134 mmol/L — ABNORMAL LOW (ref 135–145)
Total Bilirubin: 1.1 mg/dL (ref 0.3–1.2)
Total Protein: 5.3 g/dL — ABNORMAL LOW (ref 6.5–8.1)

## 2019-12-29 LAB — MAGNESIUM: Magnesium: 2 mg/dL (ref 1.7–2.4)

## 2019-12-29 LAB — LIPASE, BLOOD: Lipase: 217 U/L — ABNORMAL HIGH (ref 11–51)

## 2019-12-29 MED ORDER — POTASSIUM CHLORIDE CRYS ER 20 MEQ PO TBCR
60.0000 meq | EXTENDED_RELEASE_TABLET | Freq: Once | ORAL | Status: AC
  Administered 2019-12-29: 15:00:00 60 meq via ORAL
  Filled 2019-12-29: qty 3

## 2019-12-29 NOTE — Consult Note (Signed)
Referring Provider: Cleora FleetJohnson, Clanford L, MD Primary Care Physician:  Patient, No Pcp Per Primary Gastroenterologist:  Roetta SessionsMichael Rourk, MD Admission date March 29 Consultation date April 1  Reason for Consultation:  Pancreatic necrosis  HPI: Holly Joseph is a 34 y.o. female with history of alcohol abuse presented to the emergency department with epigastric/left upper quadrant pain radiating into the back associated with nausea and vomiting.  Patient drinks alcohol on a regular basis, increased use over the past several days, 1 pint of liquor daily.  Denies hematemesis.  No melena or rectal bleeding. No heartburn, dysphagia, constipation, diarrhea.   On presentation her lipase was 1664.  AST 101, ALT 54 other LFTs normal.  Creatinine normal at 0.68.  CBC normal.  Urine pregnancy test negative.  March 30: Magnesium low at 1.3 has been repleted.  Triglycerides 130.  Today her potassium is 3, creatinine normal, LFTs normal except for albumin of 2.6.  Lipase down to 217, white blood cell count 8400, hemoglobin 10.7, platelets 105,000, magnesium 2.0.  Right upper quadrant ultrasound on March 30, possible hepatic steatosis, right upper quadrant ascites.  She was febrile overnight.  T-max of 101.6.  CT abdomen pelvis with contrast obtained earlier this morning.  Acute pancreatitis noted with a small area of hypoenhancement present in the proximal pancreatic tail worrisome for pancreatic necrosis.  Peripancreatic fluid, predominantly along the pancreatic tail, without walled off necrosis/acute pancreatic collection.  Small left and trace right pleural effusions.  Patchy left lower lobe opacity, likely atelectasis, less likely pneumonia.  2 liver lesions measuring up to 4.9 cm favoring benign hepatic adenomas over FNH or hemangiomas.  Consider follow-up MRI abdomen with and without contrast in 3 months with Eovist.  Patient states she is feeling a lot better. Tolerating clear liquids. No n/v. +flatus.  Wonders when she can go home.    Prior to Admission medications   Not on File    Current Facility-Administered Medications  Medication Dose Route Frequency Provider Last Rate Last Admin  . 0.9 %  sodium chloride infusion   Intravenous Continuous Laural BenesJohnson, Clanford L, MD 150 mL/hr at 12/28/19 1556 New Bag at 12/28/19 1556  . acetaminophen (TYLENOL) tablet 650 mg  650 mg Oral Q6H PRN Zierle-Ghosh, Asia B, DO   650 mg at 12/29/19 0422   Or  . acetaminophen (TYLENOL) suppository 650 mg  650 mg Rectal Q6H PRN Zierle-Ghosh, Asia B, DO      . bisacodyl (DULCOLAX) EC tablet 5 mg  5 mg Oral Daily PRN Zierle-Ghosh, Asia B, DO      . enoxaparin (LOVENOX) injection 40 mg  40 mg Subcutaneous Q24H Zierle-Ghosh, Asia B, DO   40 mg at 12/26/19 2040  . folic acid (FOLVITE) tablet 1 mg  1 mg Oral Daily Zierle-Ghosh, Asia B, DO   1 mg at 12/29/19 0759  . LORazepam (ATIVAN) tablet 1-4 mg  1-4 mg Oral Q1H PRN Zierle-Ghosh, Asia B, DO       Or  . LORazepam (ATIVAN) injection 1-4 mg  1-4 mg Intravenous Q1H PRN Zierle-Ghosh, Asia B, DO      . metoprolol tartrate (LOPRESSOR) injection 5 mg  5 mg Intravenous Q5 min PRN Marikay Alarenny, Melanie, FNP      . morphine 4 MG/ML injection 4 mg  4 mg Intravenous Q4H PRN Zierle-Ghosh, Asia B, DO   4 mg at 12/28/19 0514  . multivitamin with minerals tablet 1 tablet  1 tablet Oral Daily Zierle-Ghosh, Asia B, DO   1 tablet  at 12/29/19 0759  . nicotine (NICODERM CQ - dosed in mg/24 hours) patch 14 mg  14 mg Transdermal Daily Emokpae, Courage, MD      . ondansetron (ZOFRAN) tablet 4 mg  4 mg Oral Q6H PRN Zierle-Ghosh, Asia B, DO       Or  . ondansetron (ZOFRAN) injection 4 mg  4 mg Intravenous Q6H PRN Zierle-Ghosh, Asia B, DO   4 mg at 12/27/19 0322  . pantoprazole (PROTONIX) EC tablet 40 mg  40 mg Oral Daily Emokpae, Courage, MD   40 mg at 12/29/19 0759  . potassium chloride SA (KLOR-CON) CR tablet 60 mEq  60 mEq Oral Once Johnson, Clanford L, MD      . senna-docusate (Senokot-S) tablet 1  tablet  1 tablet Oral QHS PRN Zierle-Ghosh, Asia B, DO      . thiamine tablet 100 mg  100 mg Oral Daily Zierle-Ghosh, Asia B, DO   100 mg at 12/29/19 0758  . traZODone (DESYREL) tablet 100 mg  100 mg Oral QHS Emokpae, Courage, MD   100 mg at 12/28/19 2159    Allergies as of 12/26/2019 - Review Complete 12/26/2019  Allergen Reaction Noted  . Latex Swelling 04/09/2013    Past Medical History:  Diagnosis Date  . BV (bacterial vaginosis)   . Eclamptic seizure   . Screening for STD (sexually transmitted disease) 12/05/2013    Past Surgical History:  Procedure Laterality Date  . NO PAST SURGERIES    . TUBAL LIGATION  06/28/2012   Procedure: POST PARTUM TUBAL LIGATION;  Surgeon: Tereso Newcomer, MD;  Location: [facility];  Service: Gynecology;  Laterality: Bilateral;  Post partum tubal ligation with filshie clips    Family History  Problem Relation Age of Onset  . Stroke Mother   . Diabetes Mother   . Hypertension Mother   . Cancer Mother        cervical   . Diabetes Sister   . Cancer Maternal Aunt   . Pancreatitis Cousin   . Other Neg Hx   . Colon cancer Neg Hx     Social History   Socioeconomic History  . Marital status: Married    Spouse name: Not on file  . Number of children: Not on file  . Years of education: Not on file  . Highest education level: Not on file  Occupational History  . Not on file  Tobacco Use  . Smoking status: Current Every Day Smoker    Packs/day: 0.50    Years: 10.00    Pack years: 5.00    Types: Cigarettes  . Smokeless tobacco: Never Used  Substance and Sexual Activity  . Alcohol use: Yes    Comment: 1 pint of liqour per day  . Drug use: Yes    Types: Marijuana  . Sexual activity: Yes    Birth control/protection: Surgical  Other Topics Concern  . Not on file  Social History Narrative  . Not on file   Social Determinants of Health   Financial Resource Strain:   . Difficulty of Paying Living Expenses:   Food Insecurity:   . Worried  About Programme researcher, broadcasting/film/video in the Last Year:   . Barista in the Last Year:   Transportation Needs:   . Freight forwarder (Medical):   Marland Kitchen Lack of Transportation (Non-Medical):   Physical Activity:   . Days of Exercise per Week:   . Minutes of Exercise per Session:   Stress:   .  Feeling of Stress :   Social Connections:   . Frequency of Communication with Friends and Family:   . Frequency of Social Gatherings with Friends and Family:   . Attends Religious Services:   . Active Member of Clubs or Organizations:   . Attends Banker Meetings:   Marland Kitchen Marital Status:   Intimate Partner Violence:   . Fear of Current or Ex-Partner:   . Emotionally Abused:   Marland Kitchen Physically Abused:   . Sexually Abused:      ROS:  General: Negative for anorexia, weight loss, fever, chills, fatigue, weakness. Eyes: Negative for vision changes.  ENT: Negative for hoarseness, difficulty swallowing , nasal congestion. CV: Negative for chest pain, angina, palpitations, dyspnea on exertion, peripheral edema.  Respiratory: Negative for dyspnea at rest, dyspnea on exertion, cough, sputum, wheezing.  GI: See history of present illness. GU:  Negative for dysuria, hematuria, urinary incontinence, urinary frequency, nocturnal urination.  MS: Negative for joint pain, low back pain.  Derm: Negative for rash or itching.  Neuro: Negative for weakness, abnormal sensation, seizure, frequent headaches, memory loss, confusion.  Psych: Negative for anxiety, depression, suicidal ideation, hallucinations. +stress with pending divorce Endo: Negative for unusual weight change.  Heme: Negative for bruising or bleeding. Allergy: Negative for rash or hives.       Physical Examination: Vital signs in last 24 hours: Temp:  [99.8 F (37.7 C)-101.6 F (38.7 C)] 100.4 F (38 C) (04/01 0351) Pulse Rate:  [97-122] 103 (04/01 0424) Resp:  [18-21] 20 (04/01 0351) BP: (132-139)/(84-94) 132/84 (04/01 0351) SpO2:   [95 %-99 %] 97 % (04/01 0424) Last BM Date: 12/26/19  General: Well-nourished, well-developed in no acute distress.  Head: Normocephalic, atraumatic.   Eyes: Conjunctiva pink, no icterus. Mouth: Oropharyngeal mucosa moist and pink , no lesions erythema or exudate. Neck: Supple without thyromegaly, masses, or lymphadenopathy.  Lungs: Clear to auscultation bilaterally.  Heart: Regular rate and rhythm, no murmurs rubs or gallops.  Abdomen: Bowel sounds are normal, minimal tenderness in right mid abd, nondistended, no hepatosplenomegaly or masses, no abdominal bruits or    hernia , no rebound or guarding.   Rectal: not performed Extremities: No lower extremity edema, clubbing, deformity.  Neuro: Alert and oriented x 4 , grossly normal neurologically.  Skin: Warm and dry, no rash or jaundice.   Psych: Alert and cooperative, normal mood and affect.        Intake/Output from previous day: No intake/output data recorded. Intake/Output this shift: No intake/output data recorded.  Lab Results: CBC Recent Labs    12/26/19 1406 12/27/19 0554 12/29/19 0527  WBC 9.7 8.7 8.4  HGB 14.8 14.6 10.7*  HCT 43.3 44.0 32.2*  MCV 97.5 98.9 100.3*  PLT 166 141* 105*   BMET Recent Labs    12/27/19 0554 12/28/19 0458 12/29/19 0527  NA 139 137 134*  K 3.8 3.4* 3.0*  CL 106 105 102  CO2 20* 22 22  GLUCOSE 135* 103* 96  BUN 12 9 <5*  CREATININE 0.62 0.52 0.55  CALCIUM 8.4* 7.6* 7.7*   LFT Recent Labs    12/26/19 1406 12/26/19 1406 12/27/19 0554 12/28/19 0458 12/29/19 0527  BILITOT 1.0  --  1.3*  --  1.1  ALKPHOS 67  --  54  --  49  AST 101*  --  48*  --  24  ALT 54*  --  35  --  17  PROT 7.2  --  6.2*  --  5.3*  ALBUMIN 4.3   < > 3.6 2.9* 2.6*   < > = values in this interval not displayed.    Lipase Recent Labs    12/26/19 1406 12/28/19 0458 12/29/19 0527  LIPASE 1,664* 1,067* 217*    PT/INR No results for input(s): LABPROT, INR in the last 72 hours.    Imaging  Studies: CT ABDOMEN PELVIS W CONTRAST  Result Date: 12/29/2019 CLINICAL DATA:  Abdominal pain, pancreatitis EXAM: CT ABDOMEN AND PELVIS WITH CONTRAST TECHNIQUE: Multidetector CT imaging of the abdomen and pelvis was performed using the standard protocol following bolus administration of intravenous contrast. CONTRAST:  OMNIPAQUE IOHEXOL 300 MG/ML  SOLN COMPARISON:  Right upper quadrant ultrasound dated 12/27/2019 FINDINGS: Lower chest: Small left and trace right pleural effusions. Patchy left lower lobe opacity, likely atelectasis, less likely pneumonia. Additional linear ground-glass opacities in the lingula and right lower lobe, likely atelectasis. Hepatobiliary: 4.9 x 4.4 cm heterogeneously enhancing lesion in segment 4, along the falciform ligament (series 2/image 25). Additional 2.1 x 2.5 cm heterogeneously enhancing lesion in segment 8 (series 2/image 26). These are not characteristic of hemangiomas and may reflect hepatic adenomas or less likely FNH. Gallbladder is unremarkable. No intrahepatic or extrahepatic ductal dilatation. Pancreas: Acute pancreatitis. A small area of hypoenhancement is present in the proximal pancreatic tail (series 2/image 36), worrisome for pancreatic necrosis. Peripancreatic fluid, predominantly along the pancreatic tail, without walled-off necrosis/acute pancreatic collection. Spleen: Within normal limits. Adrenals/Urinary Tract: Adrenal glands are within normal limits. Kidneys are within normal limits.  No hydronephrosis. Mildly thick-walled bladder, although underdistended. Stomach/Bowel: Stomach is within normal limits. No evidence of bowel obstruction. Normal appendix (series 2/image 60). Vascular/Lymphatic: No evidence of abdominal aortic aneurysm. No suspicious abdominopelvic lymphadenopathy. Reproductive: Uterus is within normal limits. Bilateral ovaries are unremarkable. Other: Small volume abdominopelvic ascites. Musculoskeletal: Visualized osseous structures are  within normal limits. IMPRESSION: Acute pancreatitis with possible focal area of pancreatic necrosis in the proximal pancreatic tail. Associated peripancreatic fluid without walled-off necrosis/acute pancreatic collection. Small left and trace right pleural effusions. Patchy left lower lobe opacity, likely atelectasis, less likely pneumonia. Two liver lesions measuring up to 4.9 cm, as described, favoring benign hepatic adenomas over FNH or hemangiomas. Consider follow-up MRI abdomen with/without contrast in 3 months for further evaluation. A hepatobiliary specific agent (Eovist) is suggested at that time. Electronically Signed   By: Charline Bills M.D.   On: 12/29/2019 00:12   DG Chest Portable 1 View  Result Date: 12/26/2019 CLINICAL DATA:  Upper abdominal, back and chest pain since yesterday. EXAM: PORTABLE CHEST 1 VIEW COMPARISON:  PA and lateral chest 07/03/2010. FINDINGS: The lungs are clear. Heart size is normal. No pneumothorax or pleural fluid. No acute or focal bony abnormality. IMPRESSION: Negative chest. Electronically Signed   By: Drusilla Kanner M.D.   On: 12/26/2019 14:16   US Abdomen Limited RUQ  Result Date: 12/27/2019 CLINICAL DATA:  Pancreatitis. EXAM: ULTRASOUND ABDOMEN LIMITED RIGHT UPPER QUADRANT COMPARISON:  No pertinent prior studies available for comparison. FINDINGS: Gallbladder: No gallstones or wall thickening visualized. No sonographic Murphy sign noted by sonographer. Common bile duct: Diameter: 5-6 mm, within normal limits Liver: No focal lesion identified. Generalized increased hepatic parenchymal echogenicity. Portal vein is patent on color Doppler imaging with normal direction of blood flow towards the liver. IMPRESSION: No gallstones are identified. The visualized common duct is normal in caliber. Generalized increased hepatic parenchymal echogenicity. This is a nonspecific finding, which may be seen in the setting of hepatic steatosis or  other chronic hepatic  parenchymal disease. Right upper quadrant ascites, possibly related to reported pancreatitis. Electronically Signed   By: Kellie Simmering DO   On: 12/27/2019 09:14  [4 week]   Impression: 66 female presenting with acute pancreatitis in setting of heavy alcohol use. Admitted 48 hours ago. Developed fever yesterday evening, CT showed small area of possible necrosis at pancreatic tail with associated surround fluid without walled-off necrosis or acute pancreatic collection.  Clinically patient's pain is improved.  Blood cultures obtained yesterday, so far no growth. No leukocytosis. Fever could be due to acute pancreatitis, would be concern she could develop infected pancreatic necrosis.    Abnormal LFTs, AST/ALT pattern consistent with alcoholic hepatitis, now resolved.  Liver lesions: 4.9 x 4.4 cm and 2.1 x 2.5 cm not characteristic of hemangiomas, may reflect hepatic adenomas, less likely FNH.  Recommend MRI abdomen with and without contrast in 3 months with Eovist. Plan: 1. Continue supportive measures.  2. Continue clear liquids. 3. Monitor closely for lack of ongoing improvement. If noted, then consider antibiotic therapy for possible infected necrosis.  4. Advised patient, would not consider discharge until she has been afebrile for more than 24 hours.  5. MRI abdomen with and without Eovist for further evaluation of hepatic lesions in 3 months.  We will make arrangements. 6. Will continue to follow with you.   Laureen Ochs. Bernarda Caffey [facility] [phone] 4/1/20212:00 PM     LOS: 3 days

## 2019-12-29 NOTE — Telephone Encounter (Addendum)
Patient currently hospitalized with pancreatitis.  Please NIC: MRI abdomen with and without Eovist in 3 months to further evaluate 2 hepatic lesions seen on CT. Also f/u pancreatic necrosis.

## 2019-12-29 NOTE — Progress Notes (Signed)
PROGRESS NOTE [facility]   Holly Joseph  JOA:[id number]  DOB: Mar 16, 1986  DOA: 12/26/2019 PCP: Patient, No Pcp Per  Brief Admission Hx: 34 year old female alcohol abuser presented with abdominal pain and noted to have acute pancreatitis.  MDM/Assessment & Plan:   1. Acute pancreatitis-suspected secondary to alcohol abuse and binge drinking-patient reports abdominal pain seems to be improving and she is having resolved nausea and no emesis at this time.  She would like to try clear liquids.  She is tolerating clear liquid diet.  Her lipid panel was reviewed with normal triglycerides.  Her total cholesterol is elevated at 233 and LDL is suboptimally controlled at 122.  Had fever yesterday.  CT abdpelvis ordered with findings suggesting pancreatic necrosis.  I asked for GI opinion.   2. Fever - blood cultures x 2 ordered.  CT abd/pelv ordered as above. Symptomatic treatment.  3. Chronic alcohol abuse-patient reports recent binge drinking continue CIWA protocol multivitamins and follow closely.  No signs of acute withdrawal at this time. 4. Tobacco abuse-nicotine patch offered in the hospital. 5. Hypophosphatemia-repleted. 6. Hypokalemia - oral replacement ordered.    DVT prophylaxis: lovenox Code Status: Full  Family Communication: patient updated with care plan at bedside, verbalizes understanding  Disposition Plan: continue inpatient management with IV fluids and supportive therapy for severe pancreatitis slow trial of clear liquid diet today.  Consultants:    Procedures:    Antimicrobials:     Subjective: Pt reports that she tolerated the clears well and abd pain is much improved today.    Objective: Vitals:   12/28/19 2013 12/29/19 0008 12/29/19 0351 12/29/19 0424  BP:  (!) 139/92 132/84   Pulse:  99 (!) 122 (!) 103  Resp:  18 20   Temp:  (!) 100.5 F (38.1 C) (!) 100.4 F (38 C)   TempSrc:  Oral Oral   SpO2: 95% 97% 97% 97%  Weight:      Height:        No intake or output data in the 24 hours ending 12/29/19 1249 Filed Weights   12/26/19 1340 12/26/19 1955  Weight: 77.1 kg 76.2 kg   REVIEW OF SYSTEMS  As per history otherwise all reviewed and reported negative  Exam:  General exam: Awake, alert, no apparent distress, cooperative. Respiratory system: Clear. No increased work of breathing. Cardiovascular system: S1 & S2 heard. No JVD, murmurs, gallops, clicks or pedal edema. Gastrointestinal system: Abdomen is nondistended, soft and rare tenderness with deep palpation with no rebound tenderness. Normal bowel sounds heard. Central nervous system: Alert and oriented. No focal neurological deficits. Extremities: no CCE.  Data Reviewed: Basic Metabolic Panel: Recent Labs  Lab 12/26/19 1406 12/27/19 0554 12/28/19 0458 12/29/19 0527  NA 137 139 137 134*  K 3.3* 3.8 3.4* 3.0*  CL 101 106 105 102  CO2 19* 20* 22 22  GLUCOSE 127* 135* 103* 96  BUN 10 12 9  <5*  CREATININE 0.68 0.62 0.52 0.55  CALCIUM 9.2 8.4* 7.6* 7.7*  MG  --  1.3* 2.2 2.0  PHOS  --  2.5 1.2*  --    Liver Function Tests: Recent Labs  Lab 12/26/19 1406 12/27/19 0554 12/28/19 0458 12/29/19 0527  AST 101* 48*  --  24  ALT 54* 35  --  17  ALKPHOS 67 54  --  49  BILITOT 1.0 1.3*  --  1.1  PROT 7.2 6.2*  --  5.3*  ALBUMIN 4.3 3.6 2.9* 2.6*   Recent  Labs  Lab 12/26/19 1406 12/28/19 0458 12/29/19 0527  LIPASE 1,664* 1,067* 217*   No results for input(s): AMMONIA in the last 168 hours. CBC: Recent Labs  Lab 12/26/19 1406 12/27/19 0554 12/29/19 0527  WBC 9.7 8.7 8.4  NEUTROABS  --  7.5 6.8  HGB 14.8 14.6 10.7*  HCT 43.3 44.0 32.2*  MCV 97.5 98.9 100.3*  PLT 166 141* 105*   Cardiac Enzymes: No results for input(s): CKTOTAL, CKMB, CKMBINDEX, TROPONINI in the last 168 hours. CBG (last 3)  Recent Labs    12/28/19 0638 12/28/19 0744 12/28/19 1119  GLUCAP 92 89 86   Recent Results (from the past 240 hour(s))  SARS CORONAVIRUS 2 (TAT 6-24  HRS) Nasopharyngeal Nasopharyngeal Swab     Status: None   Collection Time: 12/26/19  6:15 PM   Specimen: Nasopharyngeal Swab  Result Value Ref Range Status   SARS Coronavirus 2 NEGATIVE NEGATIVE Final    Comment: (NOTE) SARS-CoV-2 target nucleic acids are NOT DETECTED. The SARS-CoV-2 RNA is generally detectable in upper and lower respiratory specimens during the acute phase of infection. Negative results do not preclude SARS-CoV-2 infection, do not rule out co-infections with other pathogens, and should not be used as the sole basis for treatment or other patient management decisions. Negative results must be combined with clinical observations, patient history, and epidemiological information. The expected result is Negative. Fact Sheet for Patients: HairSlick.no Fact Sheet for Healthcare Providers: quierodirigir.com This test is not yet approved or cleared by the [place] FDA and  has been authorized for detection and/or diagnosis of SARS-CoV-2 by FDA under an Emergency Use Authorization (EUA). This EUA will remain  in effect (meaning this test can be used) for the duration of the COVID-19 declaration under Section 56 4(b)(1) of the Act, 21 U.S.C. section 360bbb-3(b)(1), unless the authorization is terminated or revoked sooner. Performed at [facility], [address], [place], [place] [address]   Culture, blood (Routine X 2) w Reflex to ID Panel     Status: None (Preliminary result)   Collection Time: 12/28/19  6:47 PM   Specimen: BLOOD RIGHT HAND  Result Value Ref Range Status   Specimen Description BLOOD RIGHT HAND  Final   Special Requests   Final    BOTTLES DRAWN AEROBIC AND ANAEROBIC Blood Culture adequate volume   Culture   Final    NO GROWTH < 12 HOURS Performed at [facility], [address] [place], [place] [address]    Report Status PENDING  Incomplete  Culture, blood (Routine X 2) w Reflex to ID  Panel     Status: None (Preliminary result)   Collection Time: 12/28/19  6:48 PM   Specimen: Right Antecubital; Blood  Result Value Ref Range Status   Specimen Description RIGHT ANTECUBITAL  Final   Special Requests   Final    BOTTLES DRAWN AEROBIC AND ANAEROBIC Blood Culture adequate volume   Culture   Final    NO GROWTH < 12 HOURS Performed at [facility], [address] [place], [place] [address]    Report Status PENDING  Incomplete     Studies: CT ABDOMEN PELVIS W CONTRAST  Result Date: 12/29/2019 CLINICAL DATA:  Abdominal pain, pancreatitis EXAM: CT ABDOMEN AND PELVIS WITH CONTRAST TECHNIQUE: Multidetector CT imaging of the abdomen and pelvis was performed using the standard protocol following bolus administration of intravenous contrast. CONTRAST:  OMNIPAQUE IOHEXOL 300 MG/ML  SOLN COMPARISON:  Right upper quadrant ultrasound dated 12/27/2019 FINDINGS:  Lower chest: Small left and trace right pleural effusions. Patchy left lower lobe opacity, likely atelectasis, less likely pneumonia. Additional linear ground-glass opacities in the lingula and right lower lobe, likely atelectasis. Hepatobiliary: 4.9 x 4.4 cm heterogeneously enhancing lesion in segment 4, along the falciform ligament (series 2/image 25). Additional 2.1 x 2.5 cm heterogeneously enhancing lesion in segment 8 (series 2/image 26). These are not characteristic of hemangiomas and may reflect hepatic adenomas or less likely FNH. Gallbladder is unremarkable. No intrahepatic or extrahepatic ductal dilatation. Pancreas: Acute pancreatitis. A small area of hypoenhancement is present in the proximal pancreatic tail (series 2/image 36), worrisome for pancreatic necrosis. Peripancreatic fluid, predominantly along the pancreatic tail, without walled-off necrosis/acute pancreatic collection. Spleen: Within normal limits. Adrenals/Urinary Tract: Adrenal glands are within normal limits. Kidneys are within normal limits.  No hydronephrosis.  Mildly thick-walled bladder, although underdistended. Stomach/Bowel: Stomach is within normal limits. No evidence of bowel obstruction. Normal appendix (series 2/image 60). Vascular/Lymphatic: No evidence of abdominal aortic aneurysm. No suspicious abdominopelvic lymphadenopathy. Reproductive: Uterus is within normal limits. Bilateral ovaries are unremarkable. Other: Small volume abdominopelvic ascites. Musculoskeletal: Visualized osseous structures are within normal limits. IMPRESSION: Acute pancreatitis with possible focal area of pancreatic necrosis in the proximal pancreatic tail. Associated peripancreatic fluid without walled-off necrosis/acute pancreatic collection. Small left and trace right pleural effusions. Patchy left lower lobe opacity, likely atelectasis, less likely pneumonia. Two liver lesions measuring up to 4.9 cm, as described, favoring benign hepatic adenomas over [place] or hemangiomas. Consider follow-up MRI abdomen with/without contrast in 3 months for further evaluation. A hepatobiliary specific agent (Eovist) is suggested at that time. Electronically Signed   By: Julian Hy M.D.   On: 12/29/2019 00:12   Scheduled Meds: . enoxaparin (LOVENOX) injection  40 mg Subcutaneous Q24H  . folic acid  1 mg Oral Daily  . multivitamin with minerals  1 tablet Oral Daily  . nicotine  14 mg Transdermal Daily  . pantoprazole  40 mg Oral Daily  . potassium chloride  60 mEq Oral Once  . thiamine  100 mg Oral Daily  . traZODone  100 mg Oral QHS   Continuous Infusions: . sodium chloride 150 mL/hr at 12/28/19 1556    Principal Problem:   Pancreatitis Active Problems:   ETOH abuse   Nicotine abuse  Time spent:   Irwin Brakeman, MD [facility] 12/29/2019, 12:49 PM    LOS: 3 days  How to contact the [facility] Attending or Consulting provider [place] or covering provider during after hours [place], for this patient?  1. Check the care team in [facility] and look for a) attending/consulting  TR[facility] provider listed and b) the [facility] team listed 2. Log into [email or web address] and use [place]'s universal password to access. If you do not have the password, please contact the hospital operator. 3. Locate the [facility] provider you are looking for under [facility] and page to a number that you can be directly reached. 4. If you still have difficulty reaching the provider, please page the [facility] (Director on Call) for the Hospitalists listed on amion for assistance.

## 2019-12-30 DIAGNOSIS — K852 Alcohol induced acute pancreatitis without necrosis or infection: Secondary | ICD-10-CM

## 2019-12-30 DIAGNOSIS — F1010 Alcohol abuse, uncomplicated: Secondary | ICD-10-CM

## 2019-12-30 DIAGNOSIS — R935 Abnormal findings on diagnostic imaging of other abdominal regions, including retroperitoneum: Secondary | ICD-10-CM

## 2019-12-30 DIAGNOSIS — R509 Fever, unspecified: Secondary | ICD-10-CM

## 2019-12-30 DIAGNOSIS — K8689 Other specified diseases of pancreas: Secondary | ICD-10-CM

## 2019-12-30 LAB — COMPREHENSIVE METABOLIC PANEL
ALT: 14 U/L (ref 0–44)
AST: 20 U/L (ref 15–41)
Albumin: 2.5 g/dL — ABNORMAL LOW (ref 3.5–5.0)
Alkaline Phosphatase: 56 U/L (ref 38–126)
Anion gap: 9 (ref 5–15)
BUN: <5 mg/dL — ABNORMAL LOW (ref 6–20)
CO2: 21 mmol/L — ABNORMAL LOW (ref 22–32)
Calcium: 8.1 mg/dL — ABNORMAL LOW (ref 8.9–10.3)
Chloride: 105 mmol/L (ref 98–111)
Creatinine, Ser: 0.52 mg/dL (ref 0.44–1.00)
GFR calc Af Amer: >60 mL/min (ref >60)
GFR calc non Af Amer: >60 mL/min (ref >60)
Glucose, Bld: 106 mg/dL — ABNORMAL HIGH (ref 70–99)
Potassium: 3.5 mmol/L (ref 3.5–5.1)
Sodium: 135 mmol/L (ref 135–145)
Total Bilirubin: 0.8 mg/dL (ref 0.3–1.2)
Total Protein: 5.6 g/dL — ABNORMAL LOW (ref 6.5–8.1)

## 2019-12-30 LAB — LIPASE, BLOOD: Lipase: 172 U/L — ABNORMAL HIGH (ref 11–51)

## 2019-12-30 NOTE — Progress Notes (Signed)
Subjective: Feeling better today. Denies abdominal pain, N/V. Had a BM this morning, no hematochezia or melena. Feels better overall. Wants to go home as soon as she can. She notes she is vegetarian/pescetarian. Tolerate clear liquids well without pain or nausea.  Objective: Vital signs in last 24 hours: Temp:  [97.9 F (36.6 C)-99.9 F (37.7 C)] 99.9 F (37.7 C) (04/02 0533) Pulse Rate:  [92-102] 92 (04/02 0533) Resp:  [15-18] 15 (04/02 0533) BP: (123-140)/(77-88) 134/81 (04/02 0533) SpO2:  [95 %-98 %] 96 % (04/02 0754) Last BM Date: 12/26/19 General:   Alert and oriented, pleasant Head:  Normocephalic and atraumatic. Eyes:  No icterus, sclera clear. Conjuctiva pink.  Heart:  S1, S2 present, no murmurs noted.  Lungs: Clear to auscultation bilaterally, without wheezing, rales, or rhonchi.  Abdomen:  Bowel sounds present, soft, non-tender, non-distended. No HSM or hernias noted. No rebound or guarding. No masses appreciated  Msk:  Symmetrical without gross deformities. Pulses:  Normal bilateral DP pulses noted. Extremities:  Without clubbing or edema. Neurologic:  Alert and  oriented x4;  grossly normal neurologically. Psych:  Alert and cooperative. Normal mood and affect.  Intake/Output from previous day: No intake/output data recorded. Intake/Output this shift: No intake/output data recorded.  Lab Results: Recent Labs    12/29/19 0527  WBC 8.4  HGB 10.7*  HCT 32.2*  PLT 105*   BMET Recent Labs    12/28/19 0458 12/29/19 0527 12/30/19 0501  NA 137 134* 135  K 3.4* 3.0* 3.5  CL 105 102 105  CO2 22 22 21*  GLUCOSE 103* 96 106*  BUN 9 <5* <5*  CREATININE 0.52 0.55 0.52  CALCIUM 7.6* 7.7* 8.1*   LFT Recent Labs    12/28/19 0458 12/29/19 0527 12/30/19 0501  PROT  --  5.3* 5.6*  ALBUMIN 2.9* 2.6* 2.5*  AST  --  24 20  ALT  --  17 14  ALKPHOS  --  49 56  BILITOT  --  1.1 0.8   PT/INR No results for input(s): LABPROT, INR in the last 72  hours. Hepatitis Panel No results for input(s): HEPBSAG, HCVAB, HEPAIGM, HEPBIGM in the last 72 hours.   Studies/Results: CT ABDOMEN PELVIS W CONTRAST  Result Date: 12/29/2019 CLINICAL DATA:  Abdominal pain, pancreatitis EXAM: CT ABDOMEN AND PELVIS WITH CONTRAST TECHNIQUE: Multidetector CT imaging of the abdomen and pelvis was performed using the standard protocol following bolus administration of intravenous contrast. CONTRAST:  OMNIPAQUE IOHEXOL 300 MG/ML  SOLN COMPARISON:  Right upper quadrant ultrasound dated 12/27/2019 FINDINGS: Lower chest: Small left and trace right pleural effusions. Patchy left lower lobe opacity, likely atelectasis, less likely pneumonia. Additional linear ground-glass opacities in the lingula and right lower lobe, likely atelectasis. Hepatobiliary: 4.9 x 4.4 cm heterogeneously enhancing lesion in segment 4, along the falciform ligament (series 2/image 25). Additional 2.1 x 2.5 cm heterogeneously enhancing lesion in segment 8 (series 2/image 26). These are not characteristic of hemangiomas and may reflect hepatic adenomas or less likely FNH. Gallbladder is unremarkable. No intrahepatic or extrahepatic ductal dilatation. Pancreas: Acute pancreatitis. A small area of hypoenhancement is present in the proximal pancreatic tail (series 2/image 36), worrisome for pancreatic necrosis. Peripancreatic fluid, predominantly along the pancreatic tail, without walled-off necrosis/acute pancreatic collection. Spleen: Within normal limits. Adrenals/Urinary Tract: Adrenal glands are within normal limits. Kidneys are within normal limits.  No hydronephrosis. Mildly thick-walled bladder, although underdistended. Stomach/Bowel: Stomach is within normal limits. No evidence of bowel obstruction. Normal appendix (series  2/image 60). Vascular/Lymphatic: No evidence of abdominal aortic aneurysm. No suspicious abdominopelvic lymphadenopathy. Reproductive: Uterus is within normal limits. Bilateral  ovaries are unremarkable. Other: Small volume abdominopelvic ascites. Musculoskeletal: Visualized osseous structures are within normal limits. IMPRESSION: Acute pancreatitis with possible focal area of pancreatic necrosis in the proximal pancreatic tail. Associated peripancreatic fluid without walled-off necrosis/acute pancreatic collection. Small left and trace right pleural effusions. Patchy left lower lobe opacity, likely atelectasis, less likely pneumonia. Two liver lesions measuring up to 4.9 cm, as described, favoring benign hepatic adenomas over [place] or hemangiomas. Consider follow-up MRI abdomen with/without contrast in 3 months for further evaluation. A hepatobiliary specific agent (Eovist) is suggested at that time. Electronically Signed   By: Julian Hy M.D.   On: 12/29/2019 00:12    Assessment: 41 female presenting with acute pancreatitis in setting of heavy alcohol use. Admitted 3 days ago. Developed fever a couple nights ago, CT showed small area of possible necrosis at pancreatic tail with associated surround fluid without walled-off necrosis or acute pancreatic collection.  Clinically patient's pain has been improved.  Blood cultures obtained 2 days ago, so far no growth x 2 days. Continues without leukocytosis. Fever could be due to acute pancreatitis, would be concern she could develop infected pancreatic necrosis. She has had a mild low grade temp over the past 24 hours (approximately 43 F).   Tolerating clear liquid diet.  Abnormal LFTs: AST/ALT pattern consistent with alcoholic hepatitis, now resolved and continues to be resolved.  Liver lesions: 4.9 x 4.4 cm and 2.1 x 2.5 cm not characteristic of hemangiomas, may reflect hepatic adenomas, less likely FNH.  Recommend MRI abdomen with and without contrast in 3 months with Eovist (scheduling request has been sent to our office).  Acute Pancreatitis with Necrosis: Has a mild low-grade temp over the past 24 hours.  Lipase  continues to improve at 172.  Generally asymptomatic today despite low grade temp. Tolerating diet well, no flare symptoms with oral intake. Wanting to go home.  Plan: 1. Continue supportive measures 2. Monitor temp and WBC count 3. Follow Lipase levels 4. Will likely advance diet to low fat vegetarian/pescetarian 5. Monitor for flare of symptoms 6. Will likely need to remain IP while febrile 7. Supportive measures  Thank you for allowing [place] to participate in the care of Mikael Spray, DNP, AGNP-C Adult & Gerontological Nurse Practitioner [facility]    LOS: 4 days    12/30/2019, 9:21 AM

## 2019-12-30 NOTE — Progress Notes (Signed)
PROGRESS NOTE [facility]   Holly Joseph  LPF:[id number]  DOB: 1985-10-16  DOA: 12/26/2019 PCP: Patient, No Pcp Per  Brief Admission Hx: 34 year old female alcohol abuser presented with abdominal pain and noted to have acute pancreatitis.  MDM/Assessment & Plan:   1. 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  She has tolerated a clear liquid diet and GI has advanced her diet to vegetarian.  Her lipid panel was reviewed with normal triglycerides.  Her total cholesterol is elevated at 233 and LDL is suboptimally controlled at 122.  She is still having low-grade temperatures and we are monitoring.  CT abdpelvis ordered with findings suggesting pancreatic necrosis.  She will need repeat MRI abdomen in 3 months as arranged by GI service.  Lipase levels are trending down. 2. Fever - blood cultures x 2 ordered.  CT abd/pelv with findings noted above. Symptomatic treatment.  3. Chronic alcohol abuse-patient reports recent binge drinking continue CIWA protocol multivitamins and follow closely.  No signs of acute withdrawal at this time. 4. Tobacco abuse-nicotine patch offered in the hospital. 5. Hypophosphatemia-repleted. 6. Hypokalemia - oral replacement ordered.    DVT prophylaxis: lovenox Code Status: Full  Family Communication: patient updated with care plan at bedside, verbalizes understanding  Disposition Plan: Patient had fever in last 24 hours, waiting for patient to remain afebrile for 24 hours prior to DC, continue inpatient management with IV fluids and supportive therapy for severe pancreatitis, advancing diet today  Consultants:  GI  Procedures:    Antimicrobials:     Subjective: Pt wants to go home.  She tolerated clear liquid diet.  GI has advance her diet today.    Objective: Vitals:    12/29/19 1400 12/29/19 2053 12/30/19 0533 12/30/19 0754  BP: 140/88 123/77 134/81   Pulse: 97 (!) 102 92   Resp: 18 16 15    Temp: 99.8 F (37.7 C) 97.9 F (36.6 C) 99.9 F (37.7 C)   TempSrc: Oral  Oral   SpO2: 97% 98% 95% 96%  Weight:      Height:       No intake or output data in the 24 hours ending 12/30/19 1240 Filed Weights   12/26/19 1340 12/26/19 1955  Weight: 77.1 kg 76.2 kg   REVIEW OF SYSTEMS  As per history otherwise all reviewed and reported negative  Exam:  General exam: Awake, alert, no apparent distress, cooperative. Respiratory system: Clear. No increased work of breathing. Cardiovascular system: S1 & S2 heard. No JVD, murmurs, gallops, clicks or pedal edema. Gastrointestinal system: Abdomen is nondistended, soft and rare tenderness with deep palpation with no rebound tenderness. Normal bowel sounds heard. Central nervous system: Alert and oriented. No focal neurological deficits. Extremities: no CCE.  Data Reviewed: Basic Metabolic Panel: Recent Labs  Lab 12/26/19 1406 12/27/19 0554 12/28/19 0458 12/29/19 0527 12/30/19 0501  NA 137 139 137 134* 135  K 3.3* 3.8 3.4* 3.0* 3.5  CL 101 106 105 102 105  CO2 19* 20* 22 22 21*  GLUCOSE 127* 135* 103* 96 106*  BUN 10 12 9  <5* <5*  CREATININE 0.68 0.62 0.52 0.55 0.52  CALCIUM 9.2 8.4* 7.6* 7.7* 8.1*  MG  --  1.3* 2.2 2.0  --   PHOS  --  2.5 1.2*  --   --    Liver Function Tests: Recent Labs  Lab 12/26/19 1406 12/27/19 0554 12/28/19 0458 12/29/19 0527 12/30/19 0501  AST 101* 48*  --  24 20  ALT 54* 35  --  17 14  ALKPHOS 67 54  --  49 56  BILITOT 1.0 1.3*  --  1.1 0.8  PROT 7.2 6.2*  --  5.3* 5.6*  ALBUMIN 4.3 3.6 2.9* 2.6* 2.5*   Recent Labs  Lab 12/26/19 1406 12/28/19 0458 12/29/19 0527 12/30/19 0501  LIPASE 1,664* 1,067* 217* 172*   No results for input(s): AMMONIA in the last 168 hours. CBC: Recent Labs  Lab 12/26/19 1406 12/27/19 0554 12/29/19 0527  WBC 9.7 8.7 8.4    NEUTROABS  --  7.5 6.8  HGB 14.8 14.6 10.7*  HCT 43.3 44.0 32.2*  MCV 97.5 98.9 100.3*  PLT 166 141* 105*   Cardiac Enzymes: No results for input(s): CKTOTAL, CKMB, CKMBINDEX, TROPONINI in the last 168 hours. CBG (last 3)  Recent Labs    12/28/19 0638 12/28/19 0744 12/28/19 1119  GLUCAP 92 89 86   Recent Results (from the past 240 hour(s))  SARS CORONAVIRUS 2 (TAT 6-24 HRS) Nasopharyngeal Nasopharyngeal Swab     Status: None   Collection Time: 12/26/19  6:15 PM   Specimen: Nasopharyngeal Swab  Result Value Ref Range Status   SARS Coronavirus 2 NEGATIVE NEGATIVE Final    Comment: (NOTE) SARS-CoV-2 target nucleic acids are NOT DETECTED. The SARS-CoV-2 RNA is generally detectable in upper and lower respiratory specimens during the acute phase of infection. Negative results do not preclude SARS-CoV-2 infection, do not rule out co-infections with other pathogens, and should not be used as the sole basis for treatment or other patient management decisions. Negative results must be combined with clinical observations, patient history, and epidemiological information. The expected result is Negative. Fact Sheet for Patients: HairSlick.no Fact Sheet for Healthcare Providers: quierodirigir.com This test is not yet approved or cleared by the [place] FDA and  has been authorized for detection and/or diagnosis of SARS-CoV-2 by FDA under an Emergency Use Authorization (EUA). This EUA will remain  in effect (meaning this test can be used) for the duration of the COVID-19 declaration under Section 56 4(b)(1) of the Act, 21 U.S.C. section 360bbb-3(b)(1), unless the authorization is terminated or revoked sooner. Performed at [facility], [address] [place], [place] [address]   Culture, blood (Routine X 2) w Reflex to ID Panel     Status: None (Preliminary result)   Collection Time: 12/28/19  6:47 PM   Specimen:  BLOOD RIGHT HAND  Result Value Ref Range Status   Specimen Description BLOOD RIGHT HAND  Final   Special Requests   Final    BOTTLES DRAWN AEROBIC AND ANAEROBIC Blood Culture adequate volume   Culture   Final    NO GROWTH 2 DAYS Performed at [facility], [address], [place], [place] [address]    Report Status PENDING  Incomplete  Culture, blood (Routine X 2) w Reflex to ID Panel     Status: None (Preliminary result)   Collection Time: 12/28/19  6:48 PM   Specimen: Right Antecubital; Blood  Result Value Ref Range Status   Specimen Description RIGHT ANTECUBITAL  Final   Special Requests   Final    BOTTLES DRAWN AEROBIC AND ANAEROBIC Blood Culture adequate volume   Culture   Final    NO GROWTH 2 DAYS Performed at [facility], [address], [place], [place] [address]    Report Status PENDING  Incomplete     Studies: CT ABDOMEN PELVIS W CONTRAST  Result Date: 12/29/2019 CLINICAL DATA:  Abdominal pain, pancreatitis EXAM: CT ABDOMEN AND PELVIS WITH CONTRAST TECHNIQUE: Multidetector CT imaging of the abdomen and pelvis was performed using the standard protocol following bolus administration of intravenous contrast. CONTRAST:  OMNIPAQUE IOHEXOL 300 MG/ML  SOLN COMPARISON:  Right upper quadrant ultrasound dated 12/27/2019 FINDINGS: Lower chest: Small left and trace right pleural effusions. Patchy left lower lobe opacity, likely atelectasis, less likely pneumonia. Additional linear ground-glass opacities in the lingula and right lower lobe, likely atelectasis. Hepatobiliary: 4.9 x 4.4 cm heterogeneously enhancing lesion in segment 4, along the falciform ligament (series 2/image 25). Additional 2.1 x 2.5 cm heterogeneously enhancing lesion in segment 8 (series 2/image 26). These are not characteristic of hemangiomas and may reflect hepatic adenomas or less likely FNH. Gallbladder is unremarkable. No intrahepatic or extrahepatic ductal dilatation. Pancreas: Acute pancreatitis. A small area  of hypoenhancement is present in the proximal pancreatic tail (series 2/image 36), worrisome for pancreatic necrosis. Peripancreatic fluid, predominantly along the pancreatic tail, without walled-off necrosis/acute pancreatic collection. Spleen: Within normal limits. Adrenals/Urinary Tract: Adrenal glands are within normal limits. Kidneys are within normal limits.  No hydronephrosis. Mildly thick-walled bladder, although underdistended. Stomach/Bowel: Stomach is within normal limits. No evidence of bowel obstruction. Normal appendix (series 2/image 60). Vascular/Lymphatic: No evidence of abdominal aortic aneurysm. No suspicious abdominopelvic lymphadenopathy. Reproductive: Uterus is within normal limits. Bilateral ovaries are unremarkable. Other: Small volume abdominopelvic ascites. Musculoskeletal: Visualized osseous structures are within normal limits. IMPRESSION: Acute pancreatitis with possible focal area of pancreatic necrosis in the proximal pancreatic tail. Associated peripancreatic fluid without walled-off necrosis/acute pancreatic collection. Small left and trace right pleural effusions. Patchy left lower lobe opacity, likely atelectasis, less likely pneumonia. Two liver lesions measuring up to 4.9 cm, as described, favoring benign hepatic adenomas over FNH or hemangiomas. Consider follow-up MRI abdomen with/without contrast in 3 months for further evaluation. A hepatobiliary specific agent (Eovist) is suggested at that time. Electronically Signed   By: Charline Bills M.D.   On: 12/29/2019 00:12   Scheduled Meds: . enoxaparin (LOVENOX) injection  40 mg Subcutaneous Q24H  . folic acid  1 mg Oral Daily  . multivitamin with minerals  1 tablet Oral Daily  . nicotine  14 mg Transdermal Daily  . pantoprazole  40 mg Oral Daily  . thiamine  100 mg Oral Daily  . traZODone  100 mg Oral QHS   Continuous Infusions: . sodium chloride 100 mL/hr at 12/29/19 1509    Principal Problem:    Pancreatitis Active Problems:   ETOH abuse   Nicotine abuse   Pancreatic necrosis   Fever   Abnormal CT of the abdomen  Time spent:   Standley Dakins, MD [facility] 12/30/2019, 12:40 PM    LOS: 4 days  How to contact the [facility] Attending or Consulting provider 7A - 7P or covering provider during after hours 7P -7A, for this patient?  1. Check the care team in [facility] and look for a) attending/consulting [facility] provider listed and b) the [facility] team listed 2. Log into [email or web address] and use [place]'s universal password to access. If you do not have the password, please contact the hospital operator. 3. Locate the [facility] provider you are looking for under [facility] and page to a number that you can be directly reached. 4. If you still have difficulty reaching the provider, please page the [facility] (Director on Call) for the Hospitalists listed  on amion for assistance.

## 2019-12-31 ENCOUNTER — Telehealth: Payer: Self-pay | Admitting: Gastroenterology

## 2019-12-31 ENCOUNTER — Encounter (HOSPITAL_COMMUNITY): Payer: Self-pay | Admitting: Family Medicine

## 2019-12-31 LAB — COMPREHENSIVE METABOLIC PANEL
ALT: 16 U/L (ref 0–44)
AST: 23 U/L (ref 15–41)
Albumin: 2.4 g/dL — ABNORMAL LOW (ref 3.5–5.0)
Alkaline Phosphatase: 59 U/L (ref 38–126)
Anion gap: 9 (ref 5–15)
BUN: <5 mg/dL — ABNORMAL LOW (ref 6–20)
CO2: 22 mmol/L (ref 22–32)
Calcium: 8.3 mg/dL — ABNORMAL LOW (ref 8.9–10.3)
Chloride: 103 mmol/L (ref 98–111)
Creatinine, Ser: 0.53 mg/dL (ref 0.44–1.00)
GFR calc Af Amer: >60 mL/min (ref >60)
GFR calc non Af Amer: >60 mL/min (ref >60)
Glucose, Bld: 106 mg/dL — ABNORMAL HIGH (ref 70–99)
Potassium: 3.2 mmol/L — ABNORMAL LOW (ref 3.5–5.1)
Sodium: 134 mmol/L — ABNORMAL LOW (ref 135–145)
Total Bilirubin: 0.4 mg/dL (ref 0.3–1.2)
Total Protein: 5.5 g/dL — ABNORMAL LOW (ref 6.5–8.1)

## 2019-12-31 LAB — CBC
HCT: 31.1 % — ABNORMAL LOW (ref 36.0–46.0)
Hemoglobin: 10.7 g/dL — ABNORMAL LOW (ref 12.0–15.0)
MCH: 33.2 pg (ref 26.0–34.0)
MCHC: 34.4 g/dL (ref 30.0–36.0)
MCV: 96.6 fL (ref 80.0–100.0)
Platelets: 218 10*3/uL (ref 150–400)
RBC: 3.22 MIL/uL — ABNORMAL LOW (ref 3.87–5.11)
RDW: 13.2 % (ref 11.5–15.5)
WBC: 8.4 10*3/uL (ref 4.0–10.5)
nRBC: 0 % (ref 0.0–0.2)

## 2019-12-31 LAB — LIPASE, BLOOD: Lipase: 152 U/L — ABNORMAL HIGH (ref 11–51)

## 2019-12-31 LAB — MAGNESIUM: Magnesium: 1.7 mg/dL (ref 1.7–2.4)

## 2019-12-31 MED ORDER — POTASSIUM CHLORIDE CRYS ER 20 MEQ PO TBCR
40.0000 meq | EXTENDED_RELEASE_TABLET | Freq: Once | ORAL | Status: AC
  Administered 2019-12-31: 10:00:00 40 meq via ORAL
  Filled 2019-12-31: qty 2

## 2019-12-31 MED ORDER — AMLODIPINE BESYLATE 5 MG PO TABS: 5.0000 mg | ORAL_TABLET | Freq: Every day | ORAL | 1 refills | Status: DC

## 2019-12-31 MED ORDER — MAGNESIUM SULFATE 2 GM/50ML IV SOLN
2.0000 g | Freq: Once | INTRAVENOUS | Status: AC
  Administered 2019-12-31: 10:00:00 2 g via INTRAVENOUS
  Filled 2019-12-31: qty 50

## 2019-12-31 MED ORDER — FOLIC ACID 1 MG PO TABS: 1.0000 mg | ORAL_TABLET | Freq: Every day | ORAL | 0 refills | Status: AC

## 2019-12-31 MED ORDER — THIAMINE HCL 100 MG PO TABS: 100.0000 mg | ORAL_TABLET | Freq: Every day | ORAL | 0 refills | Status: DC

## 2019-12-31 MED ORDER — ADULT MULTIVITAMIN W/MINERALS CH: 1.0000 | ORAL_TABLET | Freq: Every day | ORAL | 0 refills | Status: DC

## 2019-12-31 MED ORDER — OMEPRAZOLE 20 MG PO TBEC: 1.0000 | DELAYED_RELEASE_TABLET | Freq: Every day | ORAL | 0 refills | Status: DC

## 2019-12-31 NOTE — Discharge Summary (Signed)
Physician Discharge Summary  Holly Joseph JAS:[id number] DOB: March 01, 1986 DOA: 12/26/2019  PCP: Patient, No Pcp Per GI: [facility]  Admit date: 12/26/2019 Discharge date: 12/31/2019  Admitted From:  Home  Disposition: Home   Recommendations for Outpatient Follow-up:  1. Follow up with [facility] in 1 month 2. [facility] arranging for MRI abdomen to follow up liver and pancreas in 3 months  Discharge Condition: STABLE   CODE STATUS: FULL    Brief Hospitalization Summary: Please see all hospital notes, images, labs for full details of the hospitalization. ADMISSION HPI: Holly Joseph  is a 34 y.o. female, with history of eclampsia, bacterial vaginosis, presents to the ER with abdominal pain.  Patient reports that the pain started yesterday, and has been getting progressively worse since.  It is epigastric and radiates to her left side and left lower quadrant.  Patient reports associated nausea.  She reports associated emesis that looks like phlegm, yellow, but no blood.  Her last normal bowel movement was this morning.  Her last normal meal was yesterday afternoon.  She has had no appetite today.  Pain is currently improved when compared to arrival.  Patient reports pain medication given in the ER helped to improve her pain.  She has never had pancreatitis before.  Patient's gallbladder is still intact.  She reports no postprandial nausea.  Patient does report that due to mental stressors she has been drinking more than normal.  Over the last 4 to 5 days she has been drinking a pint a day of alcohol.  Patient reports that she has never had withdrawal from alcohol.  Patient denies dysuria.  Patient admits to vaginal bleeding but reports that she is on her period.  Has negative urine pregnancy today.  She is not on birth control.  She is not on any blood thinners.  Patient has no other complaints at this time.  Temperature 97.9, heart rate 85, respiratory 24, blood  pressure 180/96. Patient is afebrile with no leukocytosis. Lipase 1664 AST 101 ALT 54 Troponin 2 Chest x-ray shows nothing acute  EKG shows heart rate 67 with sinus rhythm Morphine given in the ED Zofran administered 1 L normal saline bolus given  Brief Admission Hx: 34 year old female alcohol abuser presented with abdominal pain and noted to have acute pancreatitis.  MDM/Assessment & Plan:   1. 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  She has tolerated a vegetarian diet.  Her lipid panel was reviewed with normal triglycerides.  Her total cholesterol is elevated at 233 and LDL is 122.  No fever in last 24 hours.  Normal WBC.   CT abdpelvis ordered with findings suggesting pancreatic necrosis.  She will need repeat MRI abdomen in 3 months as arranged by GI service.  Lipase levels continue trending down. Now 152.  Ok to DC home today.   2. Fever - RESOLVED.  Blood cultures x 2 now growth to date.  CT abd/pelv with findings noted above.   3. Chronic alcohol abuse-patient reports recent binge drinking continue CIWA protocol multivitamins and follow closely.  No signs of acute withdrawal at this time.  Pt advised to stop alcohol.  DC home with vitamins.  4. Tobacco abuse-nicotine patch offered in the hospital.  Pt advised to stop smoking cigarettes and using tobacco products.   5. Hypophosphatemia-repleted. 6. Hypokalemia - oral replacement given.  7. Hypomagnesemia - repleted.   8. Hypertension - TOC consulted to arrange for primary care follow up.  Amlodipine 5 mg daily ordered.   9. Liver lesions - cystic appearing, GI has seen and planning to arrange for outpatient MRI to follow up in 3 months.    DVT prophylaxis: lovenox Code Status: Full  Family Communication: patient updated with care plan at bedside, verbalizes understanding   Disposition Plan: Home with outpatient follow up with [facility].   Consultants:  GI  Procedures:    Antimicrobials:      Discharge Diagnoses:  Principal Problem:   Pancreatitis Active Problems:   ETOH abuse   Nicotine abuse   Pancreatic necrosis   Fever   Abnormal CT of the abdomen   Discharge Instructions:  Allergies as of 12/31/2019      Reactions   Latex Swelling      Medication List    TAKE these medications   amLODipine 5 MG tablet Commonly known as: NORVASC Take 1 tablet (5 mg total) by mouth daily.   folic acid 1 MG tablet Commonly known as: FOLVITE Take 1 tablet (1 mg total) by mouth daily.   multivitamin with minerals Tabs tablet Take 1 tablet by mouth daily.   Omeprazole 20 MG Tbec Take 1 tablet (20 mg total) by mouth daily with breakfast.   thiamine 100 MG tablet Take 1 tablet (100 mg total) by mouth daily.      Follow-up Information    [facility]. Schedule an appointment as soon as possible for a visit in 1 month(s).   Why: Hospital Follow Up for pancreatitis  Contact information: [address] [place] [id number] [phone]         Allergies  Allergen Reactions  . Latex Swelling   Allergies as of 12/31/2019      Reactions   Latex Swelling      Medication List    TAKE these medications   amLODipine 5 MG tablet Commonly known as: NORVASC Take 1 tablet (5 mg total) by mouth daily.   folic acid 1 MG tablet Commonly known as: FOLVITE Take 1 tablet (1 mg total) by mouth daily.   multivitamin with minerals Tabs tablet Take 1 tablet by mouth daily.   Omeprazole 20 MG Tbec Take 1 tablet (20 mg total) by mouth daily with breakfast.   thiamine 100 MG tablet Take 1 tablet (100 mg total) by mouth daily.       Procedures/Studies: CT ABDOMEN PELVIS W CONTRAST  Result Date: 12/29/2019 CLINICAL DATA:  Abdominal pain, pancreatitis EXAM: CT ABDOMEN AND PELVIS WITH CONTRAST  TECHNIQUE: Multidetector CT imaging of the abdomen and pelvis was performed using the standard protocol following bolus administration of intravenous contrast. CONTRAST:  100mL OMNIPAQUE IOHEXOL 300 MG/ML  SOLN COMPARISON:  Right upper quadrant ultrasound dated 12/27/2019 FINDINGS: Lower chest: Small left and trace right pleural effusions. Patchy left lower lobe opacity, likely atelectasis, less likely pneumonia. Additional linear ground-glass opacities in the lingula and right lower lobe, likely atelectasis. Hepatobiliary: 4.9 x 4.4 cm heterogeneously enhancing lesion in segment 4, along the falciform ligament (series 2/image 25). Additional 2.1 x 2.5 cm heterogeneously enhancing lesion in segment 8 (series 2/image 26). These are not characteristic of hemangiomas and may reflect hepatic adenomas or less likely FNH. Gallbladder is unremarkable. No intrahepatic or extrahepatic ductal dilatation. Pancreas: Acute pancreatitis. A small area of hypoenhancement is present in the proximal pancreatic tail (series 2/image 36), worrisome for pancreatic necrosis. Peripancreatic fluid,  predominantly along the pancreatic tail, without walled-off necrosis/acute pancreatic collection. Spleen: Within normal limits. Adrenals/Urinary Tract: Adrenal glands are within normal limits. Kidneys are within normal limits.  No hydronephrosis. Mildly thick-walled bladder, although underdistended. Stomach/Bowel: Stomach is within normal limits. No evidence of bowel obstruction. Normal appendix (series 2/image 60). Vascular/Lymphatic: No evidence of abdominal aortic aneurysm. No suspicious abdominopelvic lymphadenopathy. Reproductive: Uterus is within normal limits. Bilateral ovaries are unremarkable. Other: Small volume abdominopelvic ascites. Musculoskeletal: Visualized osseous structures are within normal limits. IMPRESSION: Acute pancreatitis with possible focal area of pancreatic necrosis in the proximal pancreatic tail. Associated  peripancreatic fluid without walled-off necrosis/acute pancreatic collection. Small left and trace right pleural effusions. Patchy left lower lobe opacity, likely atelectasis, less likely pneumonia. Two liver lesions measuring up to 4.9 cm, as described, favoring benign hepatic adenomas over Nectar or hemangiomas. Consider follow-up MRI abdomen with/without contrast in 3 months for further evaluation. A hepatobiliary specific agent (Eovist) is suggested at that time. Electronically Signed   By: Julian Hy M.D.   On: 12/29/2019 00:12   DG Chest Portable 1 View  Result Date: 12/26/2019 CLINICAL DATA:  Upper abdominal, back and chest pain since yesterday. EXAM: PORTABLE CHEST 1 VIEW COMPARISON:  PA and lateral chest 07/03/2010. FINDINGS: The lungs are clear. Heart size is normal. No pneumothorax or pleural fluid. No acute or focal bony abnormality. IMPRESSION: Negative chest. Electronically Signed   By: Inge Rise M.D.   On: 12/26/2019 14:16   US Abdomen Limited RUQ  Result Date: 12/27/2019 CLINICAL DATA:  Pancreatitis. EXAM: ULTRASOUND ABDOMEN LIMITED RIGHT UPPER QUADRANT COMPARISON:  No pertinent prior studies available for comparison. FINDINGS: Gallbladder: No gallstones or wall thickening visualized. No sonographic Murphy sign noted by sonographer. Common bile duct: Diameter: 5-6 mm, within normal limits Liver: No focal lesion identified. Generalized increased hepatic parenchymal echogenicity. Portal vein is patent on color Doppler imaging with normal direction of blood flow towards the liver. IMPRESSION: No gallstones are identified. The visualized common duct is normal in caliber. Generalized increased hepatic parenchymal echogenicity. This is a nonspecific finding, which may be seen in the setting of hepatic steatosis or other chronic hepatic parenchymal disease. Right upper quadrant ascites, possibly related to reported pancreatitis. Electronically Signed   By: Kellie Simmering DO   On: 12/27/2019  09:14      Subjective: Pt says she is feeling a lot better, tolerated diet with no problems.  She wants to go home today.    Discharge Exam: Vitals:   12/30/19 2059 12/31/19 0200  BP: (!) 153/88 (!) 141/93  Pulse: 79 96  Resp: 16 15  Temp: 99.6 F (37.6 C) 99.3 F (37.4 C)  SpO2: 98% 96%   Vitals:   12/30/19 1647 12/30/19 1752 12/30/19 2059 12/31/19 0200  BP: 136/79  (!) 153/88 (!) 141/93  Pulse: 81  79 96  Resp: 16  16 15   Temp: 100.1 F (37.8 C) 99.7 F (37.6 C) 99.6 F (37.6 C) 99.3 F (37.4 C)  TempSrc: Oral Oral Oral Oral  SpO2: 98%  98% 96%  Weight:      Height:       General: Pt is alert, awake, not in acute distress Cardiovascular: RRR, S1/S2 +, no rubs, no gallops Respiratory: CTA bilaterally, no wheezing, no rhonchi Abdominal: Soft, NT, ND, bowel sounds + Extremities: no edema, no cyanosis   The results of significant diagnostics from this hospitalization (including imaging, microbiology, ancillary and laboratory) are listed below for reference.     Microbiology:  Recent Results (from the past 240 hour(s))  SARS CORONAVIRUS 2 (TAT 6-24 HRS) Nasopharyngeal Nasopharyngeal Swab     Status: None   Collection Time: 12/26/19  6:15 PM   Specimen: Nasopharyngeal Swab  Result Value Ref Range Status   SARS Coronavirus 2 NEGATIVE NEGATIVE Final    Comment: (NOTE) SARS-CoV-2 target nucleic acids are NOT DETECTED. The SARS-CoV-2 RNA is generally detectable in upper and lower respiratory specimens during the acute phase of infection. Negative results do not preclude SARS-CoV-2 infection, do not rule out co-infections with other pathogens, and should not be used as the sole basis for treatment or other patient management decisions. Negative results must be combined with clinical observations, patient history, and epidemiological information. The expected result is Negative. Fact Sheet for Patients: HairSlick.no Fact Sheet for  Healthcare Providers: quierodirigir.com This test is not yet approved or cleared by the [place] FDA and  has been authorized for detection and/or diagnosis of SARS-CoV-2 by FDA under an Emergency Use Authorization (EUA). This EUA will remain  in effect (meaning this test can be used) for the duration of the COVID-19 declaration under Section 56 4(b)(1) of the Act, 21 U.S.C. section 360bbb-3(b)(1), unless the authorization is terminated or revoked sooner. Performed at [facility], [address] [place], [place] [address]   Culture, blood (Routine X 2) w Reflex to ID Panel     Status: None (Preliminary result)   Collection Time: 12/28/19  6:47 PM   Specimen: BLOOD RIGHT HAND  Result Value Ref Range Status   Specimen Description BLOOD RIGHT HAND  Final   Special Requests   Final    BOTTLES DRAWN AEROBIC AND ANAEROBIC Blood Culture adequate volume   Culture   Final    NO GROWTH 3 DAYS Performed at [facility], [address], [place], [place] [address]    Report Status PENDING  Incomplete  Culture, blood (Routine X 2) w Reflex to ID Panel     Status: None (Preliminary result)   Collection Time: 12/28/19  6:48 PM   Specimen: Right Antecubital; Blood  Result Value Ref Range Status   Specimen Description RIGHT ANTECUBITAL  Final   Special Requests   Final    BOTTLES DRAWN AEROBIC AND ANAEROBIC Blood Culture adequate volume   Culture   Final    NO GROWTH 3 DAYS Performed at [facility], [address], [place], [place] [address]    Report Status PENDING  Incomplete     Labs: BNP (last 3 results) No results for input(s): BNP in the last 8760 hours. Basic Metabolic Panel: Recent Labs  Lab 12/27/19 0554 12/28/19 0458 12/29/19 0527 12/30/19 0501 12/31/19 0553  NA 139 137 134* 135 134*  K 3.8 3.4* 3.0* 3.5 3.2*  CL 106 105 102 105 103  CO2 20* 22 22 21* 22  GLUCOSE 135* 103* 96 106* 106*  BUN 12 9 <5* <5* <5*  CREATININE 0.62 0.52 0.55  0.52 0.53  CALCIUM 8.4* 7.6* 7.7* 8.1* 8.3*  MG 1.3* 2.2 2.0  --  1.7  PHOS 2.5 1.2*  --   --   --    Liver Function Tests: Recent Labs  Lab 12/26/19 1406 12/26/19 1406 12/27/19 0554 12/28/19 0458 12/29/19 0527 12/30/19 0501 12/31/19 0553  AST 101*  --  48*  --  ALT 54*  --  35  --  ALKPHOS 67  --  54  --  49 56 59  BILITOT 1.0  --  1.3*  --  1.1 0.8 0.4  PROT 7.2  --  6.2*  --  5.3* 5.6* 5.5*  ALBUMIN 4.3   < > 3.6 2.9* 2.6* 2.5* 2.4*   < > = values in this interval not displayed.   Recent Labs  Lab 12/26/19 1406 12/28/19 0458 12/29/19 0527 12/30/19 0501 12/31/19 0553  LIPASE 1,664* 1,067* 217* 172* 152*   No results for input(s): AMMONIA in the last 168 hours. CBC: Recent Labs  Lab 12/26/19 1406 12/27/19 0554 12/29/19 0527 12/31/19 0553  WBC 9.7 8.7 8.4 8.4  NEUTROABS  --  7.5 6.8  --   HGB 14.8 14.6 10.7* 10.7*  HCT 43.3 44.0 32.2* 31.1*  MCV 97.5 98.9 100.3* 96.6  PLT 166 141* 105* 218   Cardiac Enzymes: No results for input(s): CKTOTAL, CKMB, CKMBINDEX, TROPONINI in the last 168 hours. BNP: Invalid input(s): POCBNP CBG: Recent Labs  Lab 12/27/19 2352 12/28/19 0638 12/28/19 0744 12/28/19 1119  GLUCAP 112* 92 89 86   D-Dimer No results for input(s): DDIMER in the last 72 hours. Hgb A1c No results for input(s): HGBA1C in the last 72 hours. Lipid Profile No results for input(s): CHOL, HDL, LDLCALC, TRIG, CHOLHDL, LDLDIRECT in the last 72 hours. Thyroid function studies No results for input(s): TSH, T4TOTAL, T3FREE, THYROIDAB in the last 72 hours.  Invalid input(s): FREET3 Anemia work up No results for input(s): VITAMINB12, FOLATE, FERRITIN, TIBC, IRON, RETICCTPCT in the last 72 hours. Urinalysis    Component Value Date/Time   COLORURINE YELLOW 12/26/2019 1630   APPEARANCEUR HAZY (A) 12/26/2019 1630   LABSPEC 1.028 12/26/2019 1630   PHURINE 6.0 12/26/2019 1630   GLUCOSEU NEGATIVE 12/26/2019 1630   HGBUR LARGE (A)  12/26/2019 1630   BILIRUBINUR NEGATIVE 12/26/2019 1630   KETONESUR 80 (A) 12/26/2019 1630   PROTEINUR 100 (A) 12/26/2019 1630   UROBILINOGEN 0.2 07/03/2010 0622   NITRITE NEGATIVE 12/26/2019 1630   LEUKOCYTESUR NEGATIVE 12/26/2019 1630   Sepsis Labs Invalid input(s): PROCALCITONIN,  WBC,  LACTICIDVEN Microbiology Recent Results (from the past 240 hour(s))  SARS CORONAVIRUS 2 (TAT 6-24 HRS) Nasopharyngeal Nasopharyngeal Swab     Status: None   Collection Time: 12/26/19  6:15 PM   Specimen: Nasopharyngeal Swab  Result Value Ref Range Status   SARS Coronavirus 2 NEGATIVE NEGATIVE Final    Comment: (NOTE) SARS-CoV-2 target nucleic acids are NOT DETECTED. The SARS-CoV-2 RNA is generally detectable in upper and lower respiratory specimens during the acute phase of infection. Negative results do not preclude SARS-CoV-2 infection, do not rule out co-infections with other pathogens, and should not be used as the sole basis for treatment or other patient management decisions. Negative results must be combined with clinical observations, patient history, and epidemiological information. The expected result is Negative. Fact Sheet for Patients: HairSlick.no Fact Sheet for Healthcare Providers: quierodirigir.com This test is not yet approved or cleared by the [place] FDA and  has been authorized for detection and/or diagnosis of SARS-CoV-2 by FDA under an Emergency Use Authorization (EUA). This EUA will remain  in effect (meaning this test can be used) for the duration of the COVID-19 declaration under Section 56 4(b)(1) of the Act, 21 U.S.C. section 360bbb-3(b)(1), unless the authorization is terminated or revoked sooner. Performed at [facility], [address], [place], [place] [address]   Culture, blood (Routine X 2) w Reflex to ID Panel     Status: None (Preliminary result)   Collection Time:  12/28/19  6:47 PM    Specimen: BLOOD RIGHT HAND  Result Value Ref Range Status   Specimen Description BLOOD RIGHT HAND  Final   Special Requests   Final    BOTTLES DRAWN AEROBIC AND ANAEROBIC Blood Culture adequate volume   Culture   Final    NO GROWTH 3 DAYS Performed at [facility], [address], [place], [place] [address]    Report Status PENDING  Incomplete  Culture, blood (Routine X 2) w Reflex to ID Panel     Status: None (Preliminary result)   Collection Time: 12/28/19  6:48 PM   Specimen: Right Antecubital; Blood  Result Value Ref Range Status   Specimen Description RIGHT ANTECUBITAL  Final   Special Requests   Final    BOTTLES DRAWN AEROBIC AND ANAEROBIC Blood Culture adequate volume   Culture   Final    NO GROWTH 3 DAYS Performed at [facility], [address], [place], [place] [address]    Report Status PENDING  Incomplete   Time coordinating discharge:  32 minutes   SIGNED:  Standley Dakins, MD  [facility] 12/31/2019, 9:20 AM How to contact the [facility] Attending or Consulting provider 7A - 7P or covering provider during after hours 7P -7A, for this patient?  1. Check the care team in [facility] and look for a) attending/consulting [facility] provider listed and b) the [facility] team listed 2. Log into [email or web address] and use [facility] universal password to access. If you do not have the password, please contact the hospital operator. 3. Locate the [facility] provider you are looking for under [facility] and page to a number that you can be directly reached. 4. If you still have difficulty reaching the provider, please page the [facility] (Director on Call) for the Hospitalists listed on amion for assistance.

## 2019-12-31 NOTE — Discharge Instructions (Signed)
YOU MUST STOP DRINKING ALCOHOL PLEASE STOP SMOKING SOFT LOW FAT DIET ONLY FOR NEXT 1-2 WEEKS FOLLOW UP WITH GI OFFICE AND THEY WILL ARRANGE A REPEAT MRI IN 3 MONTHS TO FOLLOW UP LIVER AND PANCREAS   IMPORTANT INFORMATION: PAY CLOSE ATTENTION   PHYSICIAN DISCHARGE INSTRUCTIONS  Follow with Primary care provider  Patient, No Pcp Per  and other consultants as instructed by your Hospitalist Physician  SEEK MEDICAL CARE OR RETURN TO EMERGENCY ROOM IF SYMPTOMS COME BACK, WORSEN OR NEW PROBLEM DEVELOPS   Please note: You were cared for by a hospitalist during your hospital stay. Every effort will be made to forward records to your primary care provider.  You can request that your primary care provider send for your hospital records if they have not received them.  Once you are discharged, your primary care physician will handle any further medical issues. Please note that NO REFILLS for any discharge medications will be authorized once you are discharged, as it is imperative that you return to your primary care physician (or establish a relationship with a primary care physician if you do not have one) for your post hospital discharge needs so that they can reassess your need for medications and monitor your lab values.  Please get a complete blood count and chemistry panel checked by your Primary MD at your next visit, and again as instructed by your Primary MD.  Get Medicines reviewed and adjusted: Please take all your medications with you for your next visit with your Primary MD  Laboratory/radiological data: Please request your Primary MD to go over all hospital tests and procedure/radiological results at the follow up, please ask your primary care provider to get all Hospital records sent to his/her office.  In some cases, they will be blood work, cultures and biopsy results pending at the time of your discharge. Please request that your primary care provider follow up on these results.  If  you are diabetic, please bring your blood sugar readings with you to your follow up appointment with primary care.    Please call and make your follow up appointments as soon as possible.    Also Note the following: If you experience worsening of your admission symptoms, develop shortness of breath, life threatening emergency, suicidal or homicidal thoughts you must seek medical attention immediately by calling 911 or calling your MD immediately  if symptoms less severe.  You must read complete instructions/literature along with all the possible adverse reactions/side effects for all the Medicines you take and that have been prescribed to you. Take any new Medicines after you have completely understood and accpet all the possible adverse reactions/side effects.   Do not drive when taking Pain medications or sleeping medications (Benzodiazepines)  Do not take more than prescribed Pain, Sleep and Anxiety Medications. It is not advisable to combine anxiety,sleep and pain medications without talking with your primary care practitioner  Special Instructions: If you have smoked or chewed Tobacco  in the last 2 yrs please stop smoking, stop any regular Alcohol  and or any Recreational drug use.  Wear Seat belts while driving.  Do not drive if taking any narcotic, mind altering or controlled substances or recreational drugs or alcohol.

## 2019-12-31 NOTE — Telephone Encounter (Signed)
Please arrange for hospital follow up in four weeks for pancreatitis with necrosis, liver lesions.

## 2019-12-31 NOTE — Progress Notes (Addendum)
IV removed and DC instructions reviewed Scripts and work note given.  To have follow up with gastro in one month.

## 2019-12-31 NOTE — TOC Transition Note (Signed)
Transition of Care [facility]) - CM/SW Discharge Note   Patient Details  Name: RAEGAN WINDERS MRN: [id number] Date of Birth: 06-23-1986  [facility] CM/SW Contact:  Malcolm Metro, RN Phone Number: 12/31/2019, 10:02 AM   Clinical Narrative:   DC home today. Pt uninsured and has no PCP, has been to the [facility] Dept in the past. She needs f/u with GI. She was given number for [facility], explained services. Pt's Rx on [employer]'s $4 list or offered OTC and not covered by [facility]. Pt requested note for work, MD made aware. No further TOC needs communicated.     Final next level of care: Home/Self Care     Patient Goals and CMS Choice Patient states their goals for this hospitalization and ongoing recovery are:: feel better      Discharge Placement                       Discharge Plan and Services                                     Social Determinants of Health (SDOH) Interventions     Readmission Risk Interventions Readmission Risk Prevention Plan 12/31/2019  Post Dischage Appt Not Complete  Appt Comments not able to make appointment on weekend; pt given info to call and make appointment M-F  Medication Screening Complete  Transportation Screening Complete  Some recent data might be hidden

## 2020-01-02 ENCOUNTER — Encounter: Payer: Self-pay | Admitting: Internal Medicine

## 2020-01-02 LAB — CULTURE, BLOOD (ROUTINE X 2)
Culture: NO GROWTH
Culture: NO GROWTH
Special Requests: ADEQUATE
Special Requests: ADEQUATE

## 2020-01-02 NOTE — Telephone Encounter (Signed)
Fowarding to [facility] to NIC

## 2020-01-02 NOTE — Telephone Encounter (Signed)
Patient scheduled.

## 2020-02-01 ENCOUNTER — Ambulatory Visit: Payer: Self-pay | Admitting: Gastroenterology

## 2020-02-01 ENCOUNTER — Telehealth: Payer: Self-pay | Admitting: Gastroenterology

## 2020-02-01 ENCOUNTER — Encounter: Payer: Self-pay | Admitting: Gastroenterology

## 2020-02-01 NOTE — Telephone Encounter (Signed)
PATIENT WAS A NO SHOW AND LETTER SENT  °

## 2020-02-22 ENCOUNTER — Telehealth: Payer: Self-pay | Admitting: Internal Medicine

## 2020-02-22 NOTE — Telephone Encounter (Signed)
RECALL FOR MRI 

## 2020-02-22 NOTE — Telephone Encounter (Signed)
Recall sent 

## 2020-09-20 ENCOUNTER — Encounter (HOSPITAL_COMMUNITY): Payer: Self-pay | Admitting: Family Medicine

## 2020-09-20 ENCOUNTER — Other Ambulatory Visit: Payer: Self-pay

## 2020-09-20 DIAGNOSIS — Z20822 Contact with and (suspected) exposure to covid-19: Secondary | ICD-10-CM

## 2020-09-22 LAB — NOVEL CORONAVIRUS, NAA: SARS-CoV-2, NAA: NOT DETECTED

## 2020-09-22 LAB — SARS-COV-2, NAA 2 DAY TAT

## 2020-10-29 IMAGING — DX DG CHEST 1V PORT
1 series · 1 of 1 positions shown · non-contrast
Comparison: PA and lateral chest 07/03/2010.

CLINICAL DATA: Upper abdominal, back and chest pain since
yesterday.

EXAM:
PORTABLE CHEST 1 VIEW

[chest ap]
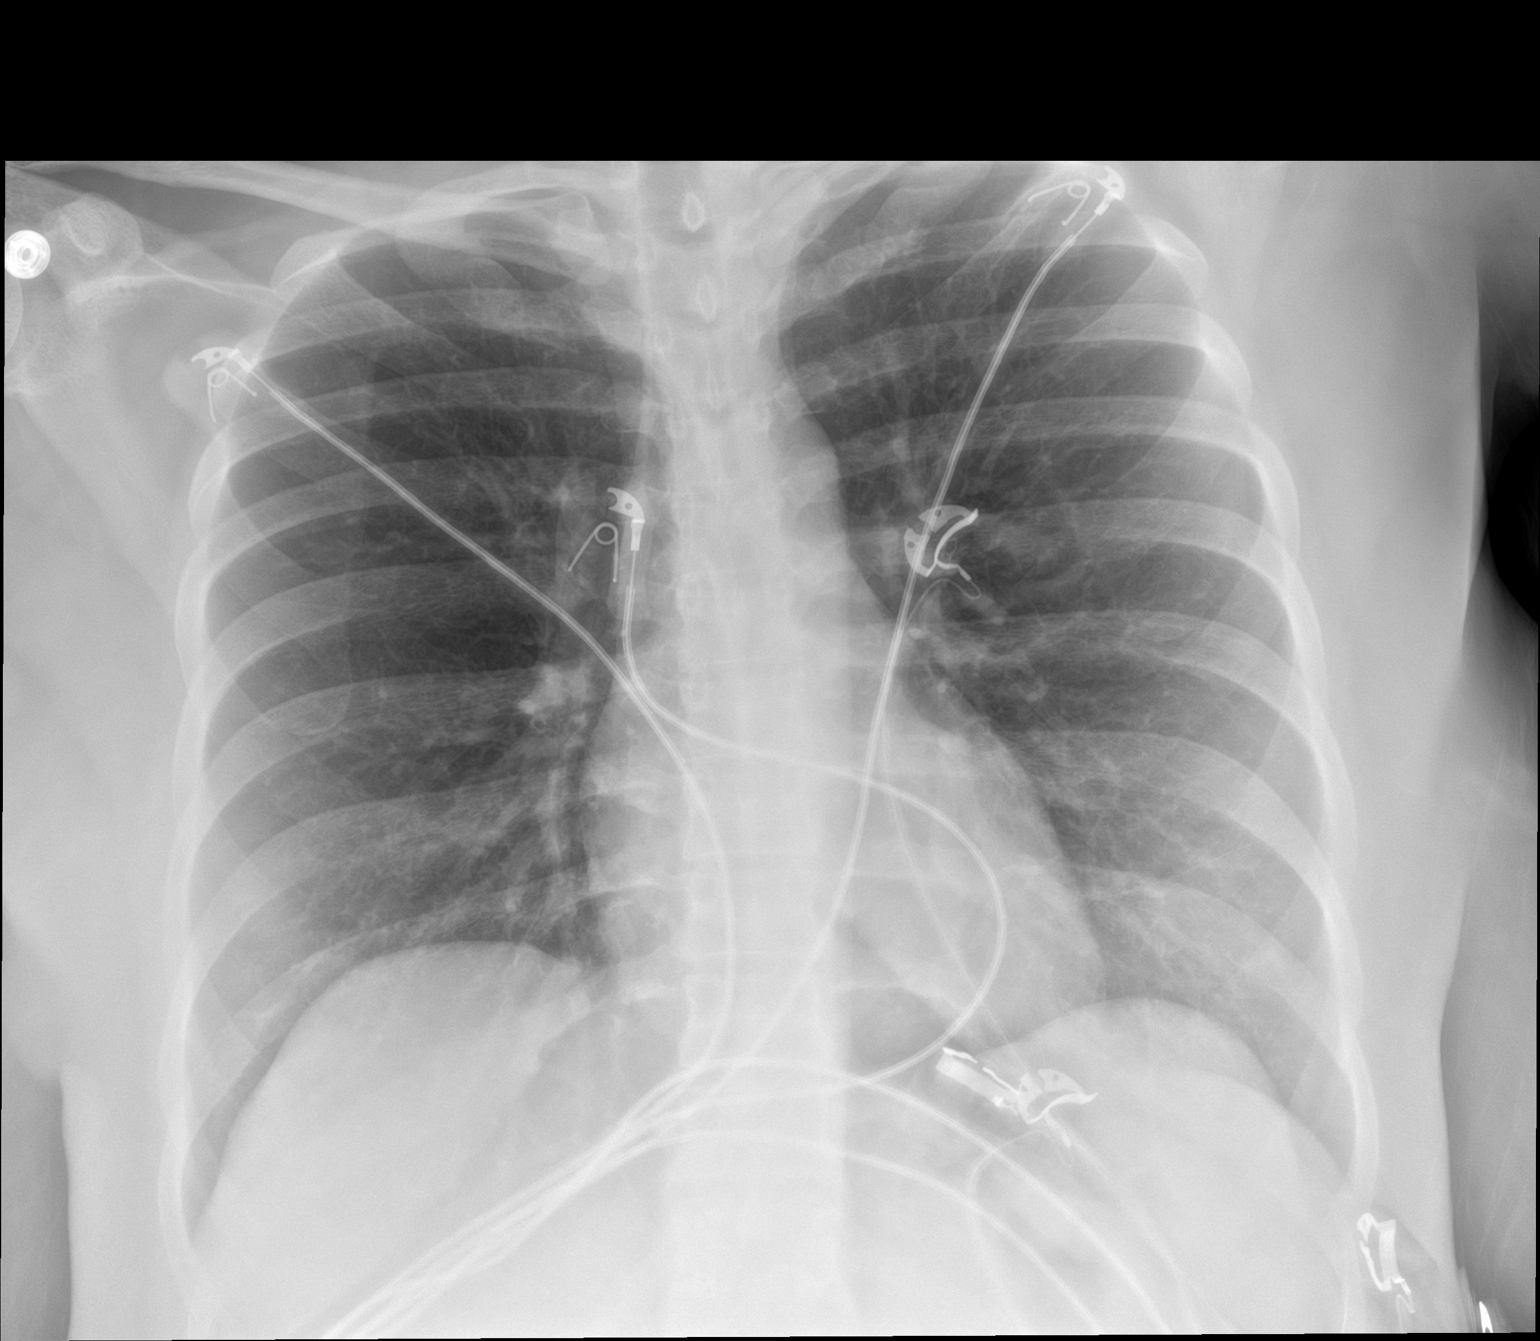

[1 of 1 positions shown; findings below may reference images not displayed]

FINDINGS: The lungs are clear. Heart size is normal. No pneumothorax or
pleural fluid. No acute or focal bony abnormality.
IMPRESSION: Negative chest.

## 2020-10-30 IMAGING — US US ABDOMEN LIMITED
1 series · 14 of 25 positions shown · non-contrast
Comparison: No pertinent prior studies available for comparison.

CLINICAL DATA: Pancreatitis.

EXAM:
ULTRASOUND ABDOMEN LIMITED RIGHT UPPER QUADRANT

[Series 1: us abdomen limited ruq · 14 of 47 slices shown]
[im 1/47]
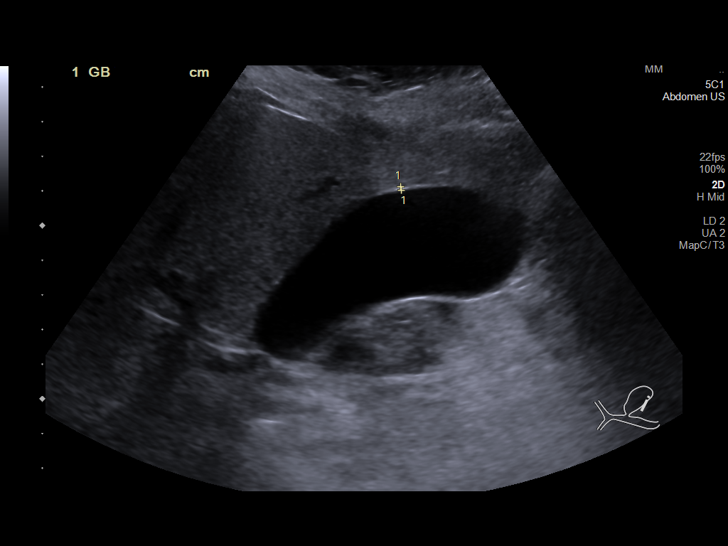
[im 4/47]
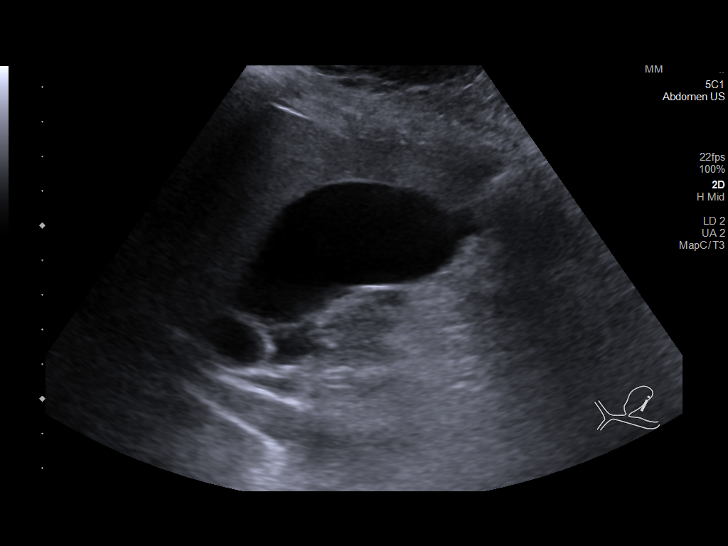
[im 8/47]
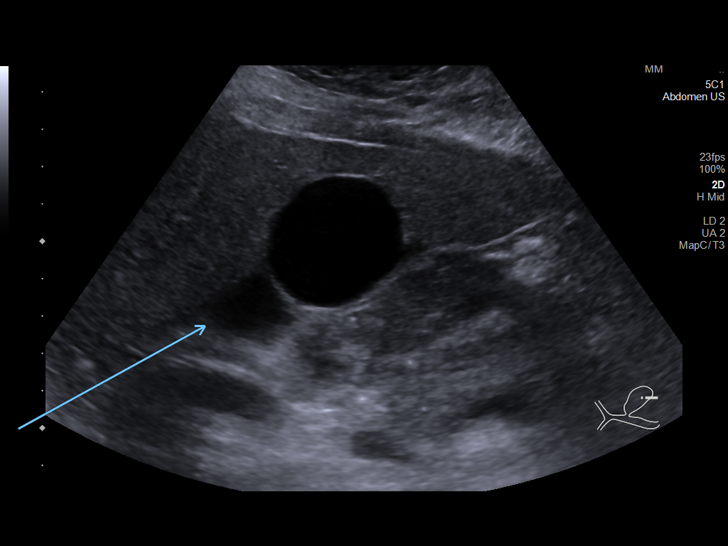
[im 12/47]
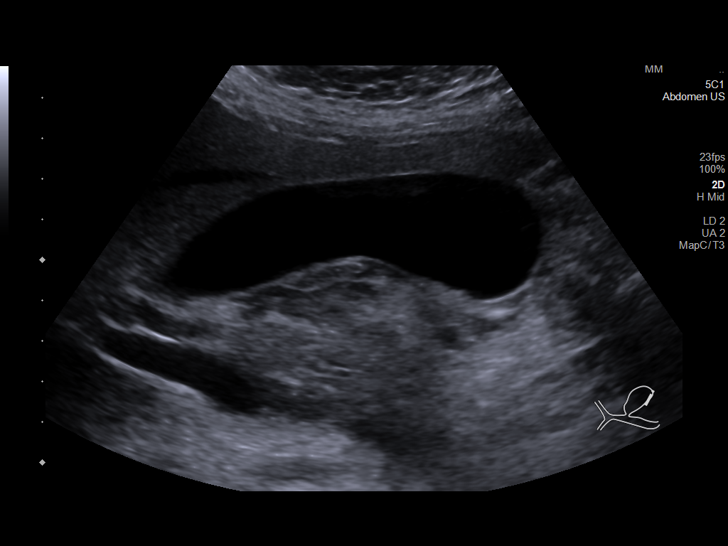
[im 16/47]
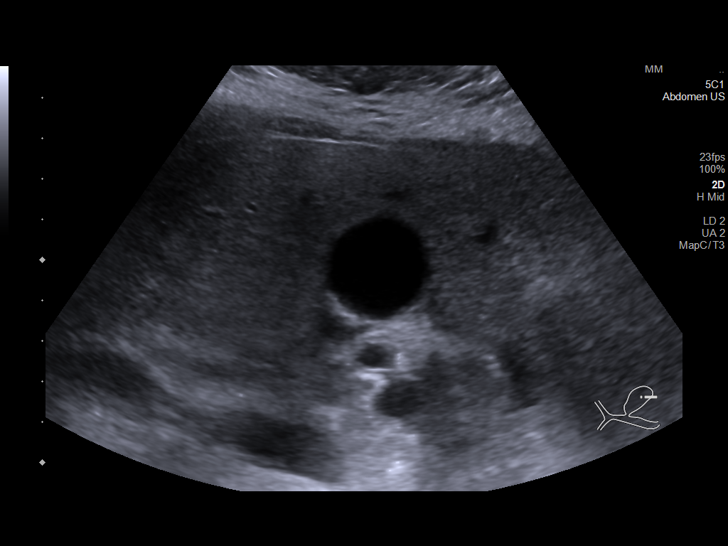
[im 18/47]
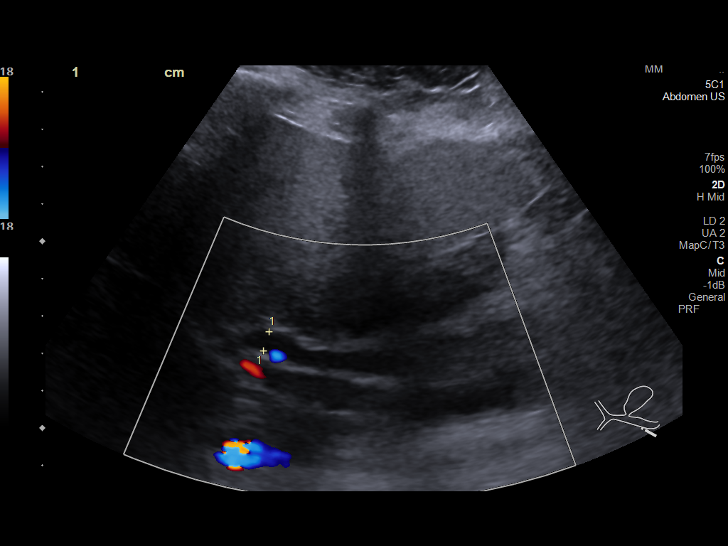
[im 22/47]
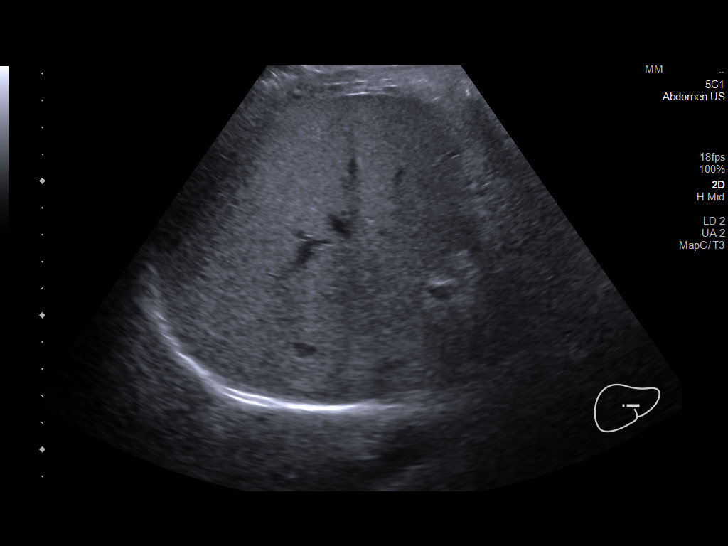
[im 25/47]
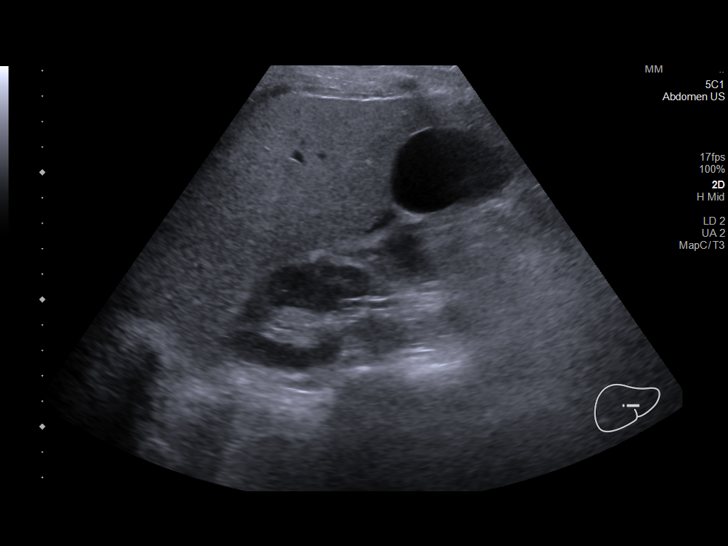
[im 29/47]
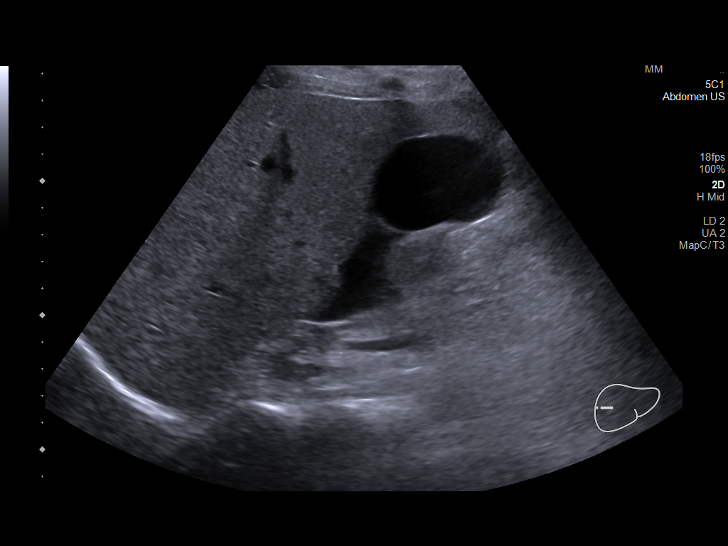
[im 31/47]
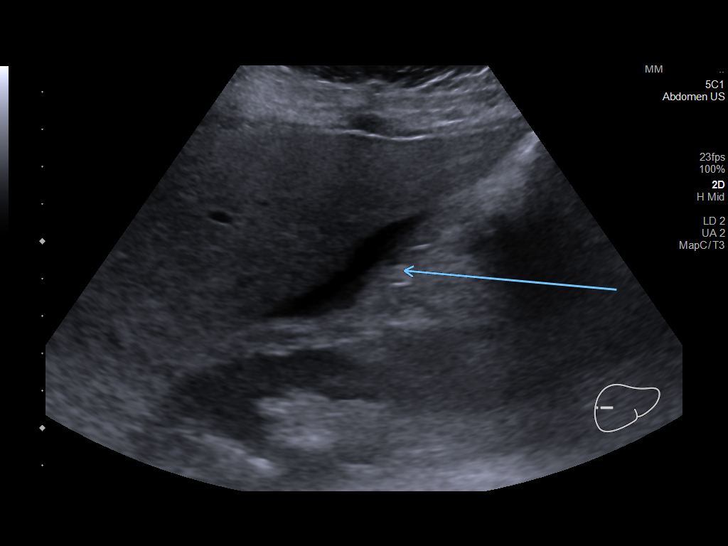
[im 35/47]
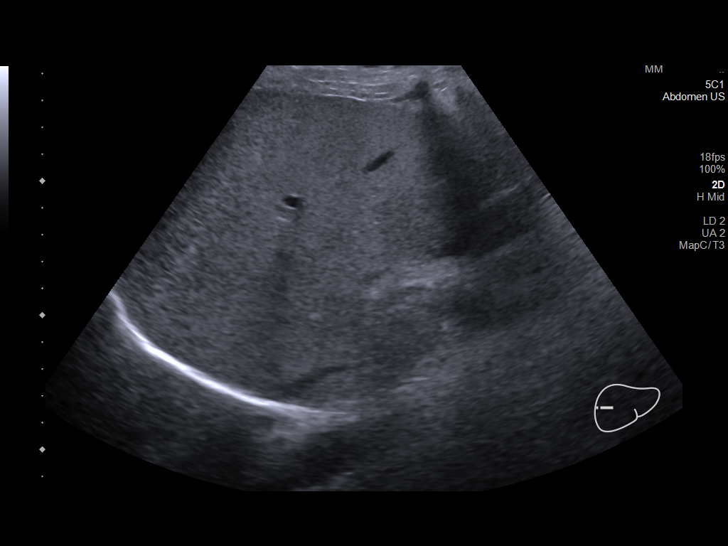
[im 39/47]
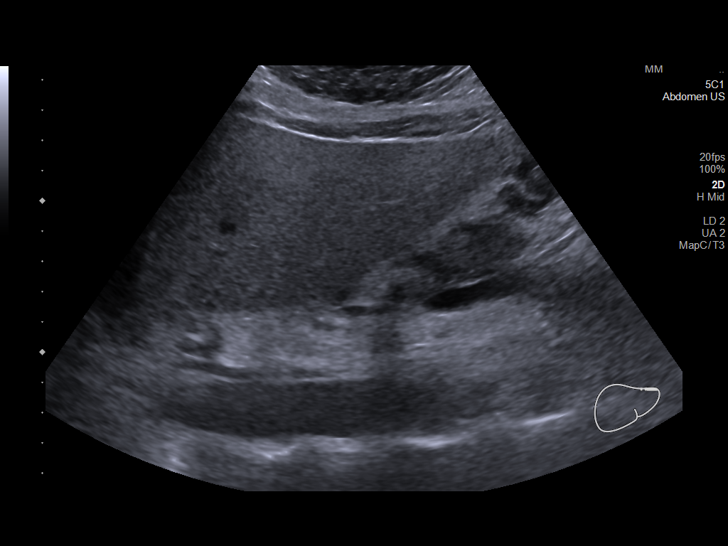
[im 43/47]
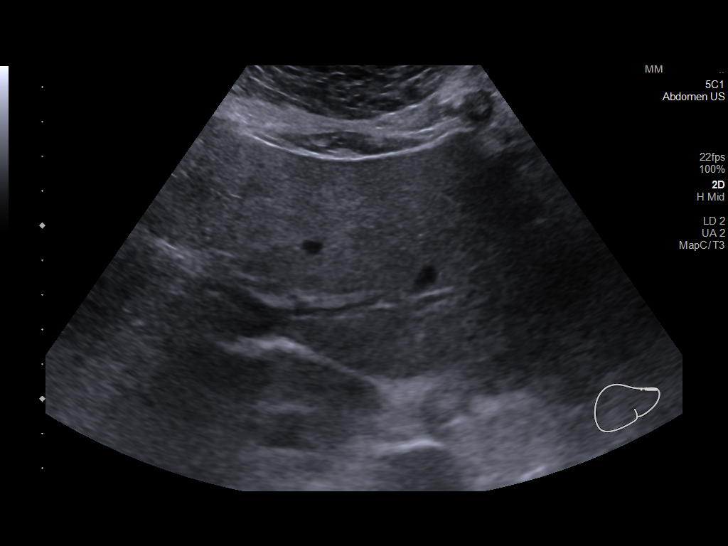
[im 47/47]
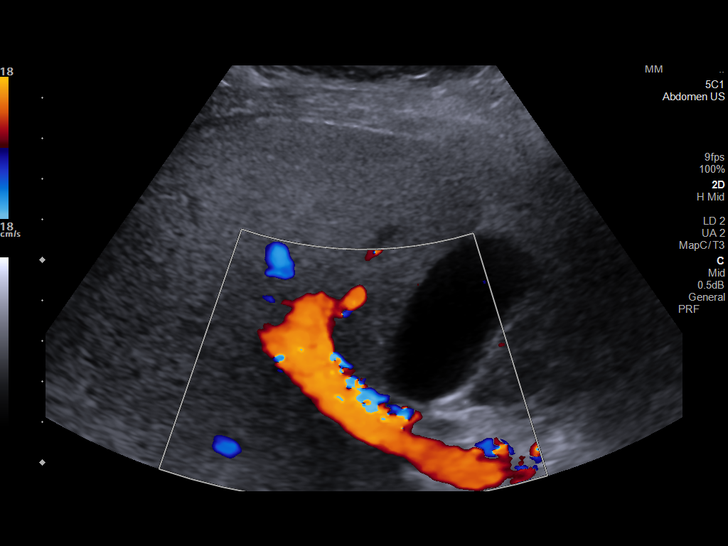

[14 of 25 positions shown; findings below may reference images not displayed]

FINDINGS: Gallbladder:

No gallstones or wall thickening visualized. No sonographic Murphy
sign noted by sonographer.

Common bile duct:

Diameter: 5-6 mm, within normal limits

Liver:

No focal lesion identified. Generalized increased hepatic
parenchymal echogenicity. Portal vein is patent on color Doppler
imaging with normal direction of blood flow towards the liver.
IMPRESSION: No gallstones are identified. The visualized common duct is normal
in caliber.

Generalized increased hepatic parenchymal echogenicity. This is a
nonspecific finding, which may be seen in the setting of hepatic
steatosis or other chronic hepatic parenchymal disease.

Right upper quadrant ascites, possibly related to reported
pancreatitis.

## 2020-10-31 IMAGING — CT CT ABD-PELV W/ CM
2 of 4 series · 16 of 46 positions shown, 18 images · IV contrast (omnipaque)
Comparison: Right upper quadrant ultrasound dated 12/27/2019

CLINICAL DATA: Abdominal pain, pancreatitis

EXAM:
CT ABDOMEN AND PELVIS WITH CONTRAST
TECHNIQUE: Multidetector CT imaging of the abdomen and pelvis was performed
using the standard protocol following bolus administration of
intravenous contrast.
CONTRAST:  100mL OMNIPAQUE IOHEXOL 300 MG/ML  SOLN

[Series 2: axial st · axial · 0.81mm/px · z∈[+796,+1241]mm · 13 of 101 slices shown, 15 images]
[im 6/101  soft-tissue]
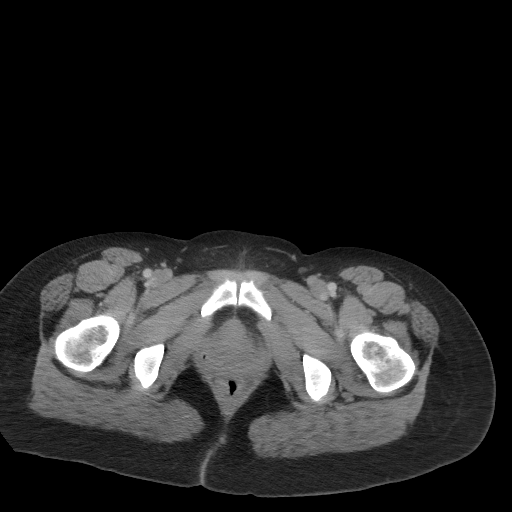
[im 6/101  bone]
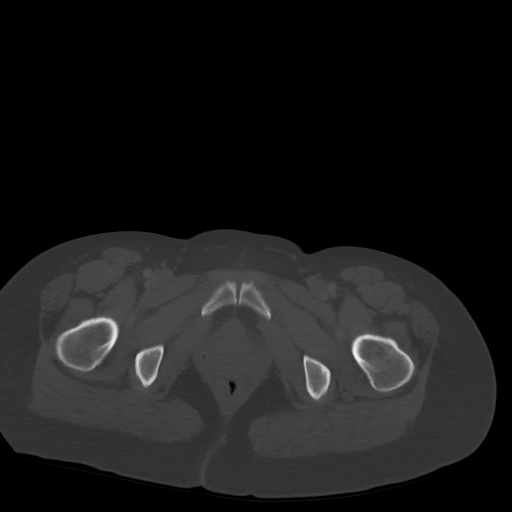
[im 16/101  soft-tissue]
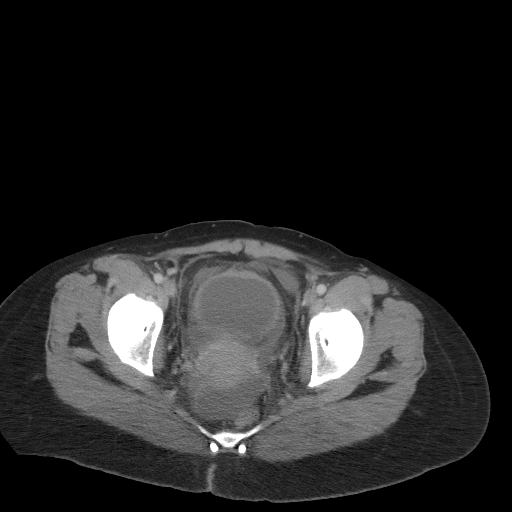
[im 22/101  soft-tissue]
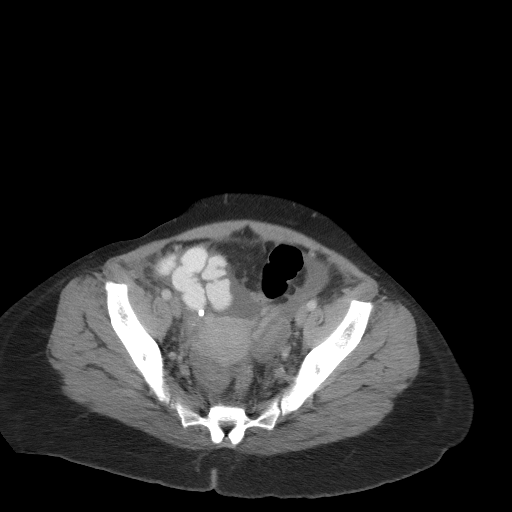
[im 27/101  soft-tissue]
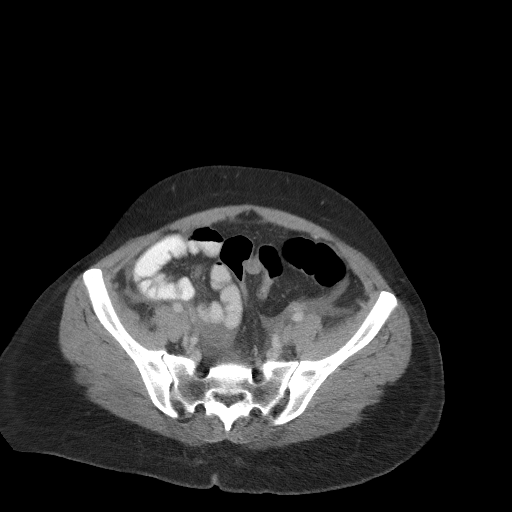
[im 37/101  soft-tissue]
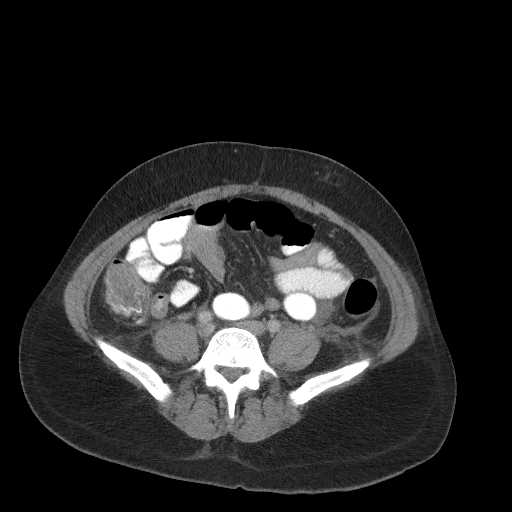
[im 43/101  soft-tissue]
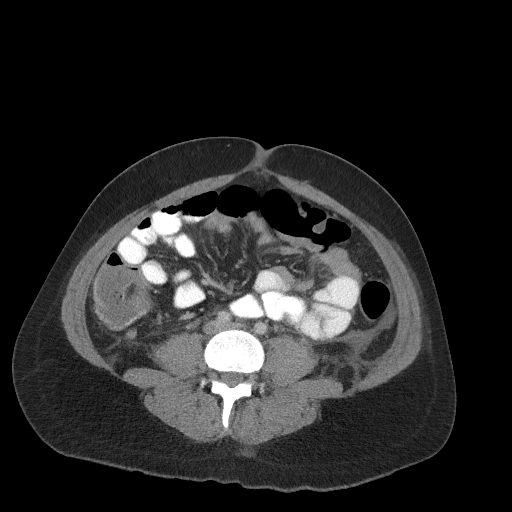
[im 53/101  soft-tissue]
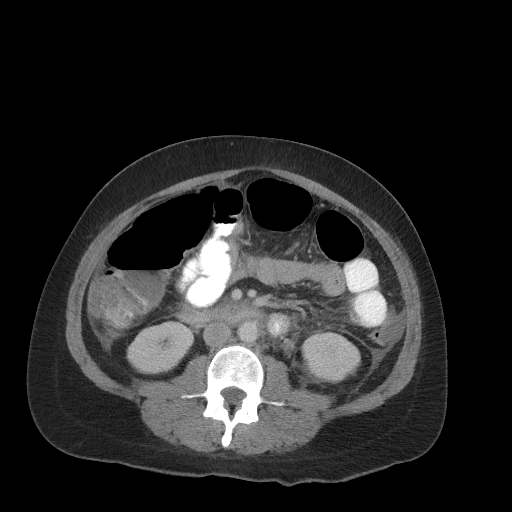
[im 58/101  soft-tissue]
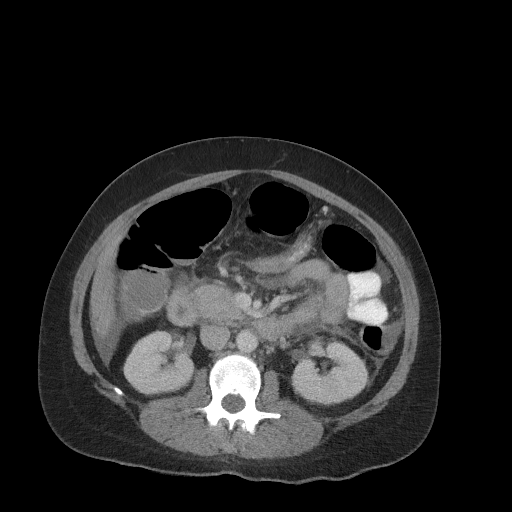
[im 64/101  soft-tissue]
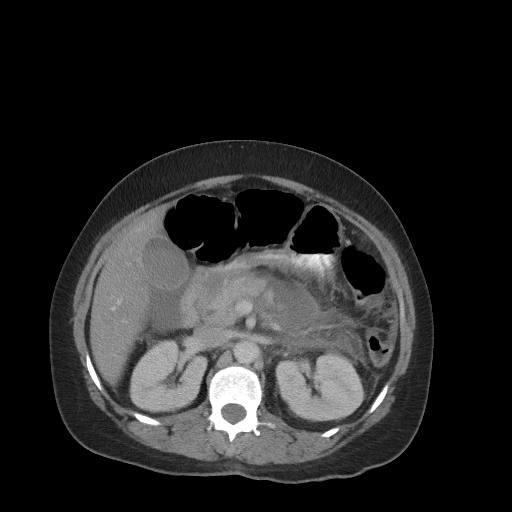
[im 64/101  bone]
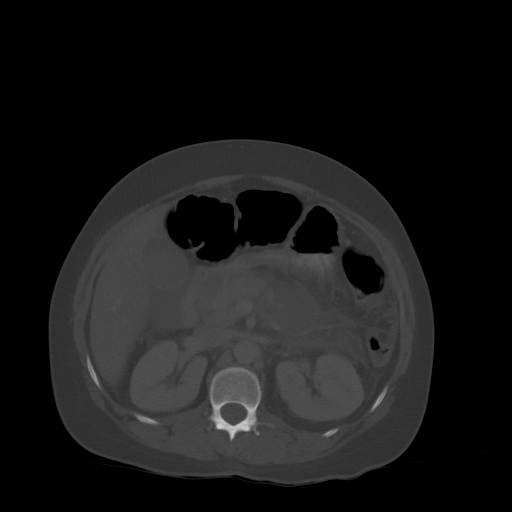
[im 74/101  soft-tissue]
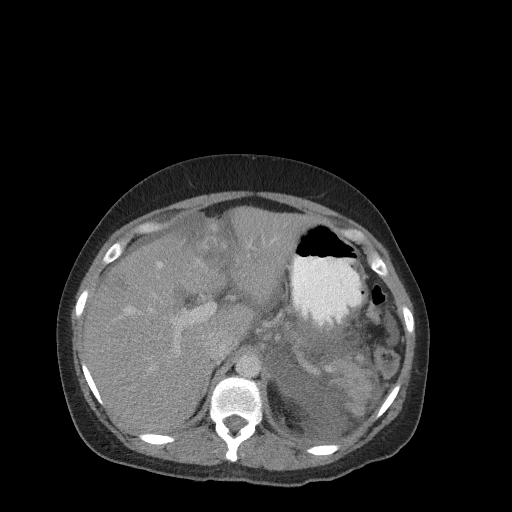
[im 79/101  soft-tissue]
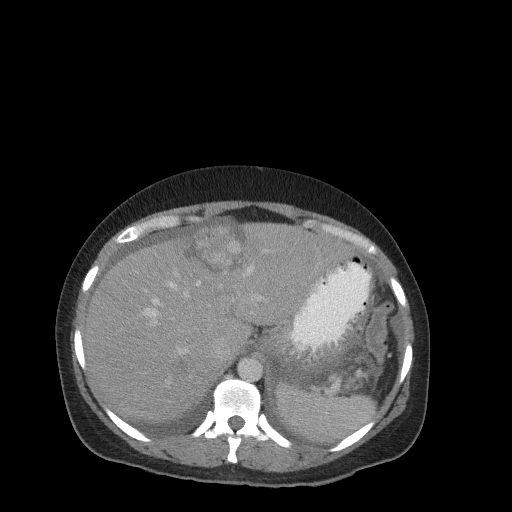
[im 85/101  soft-tissue]
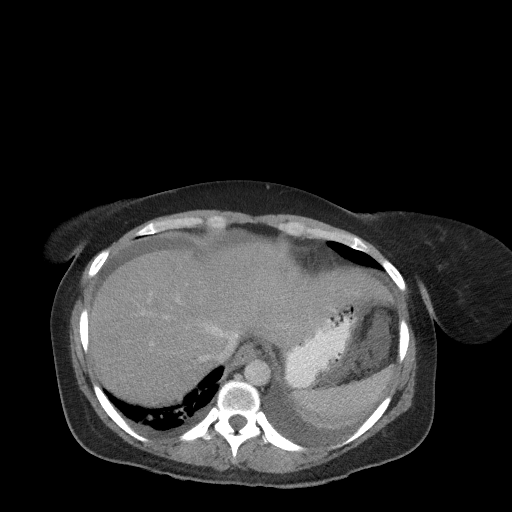
[im 95/101  soft-tissue]
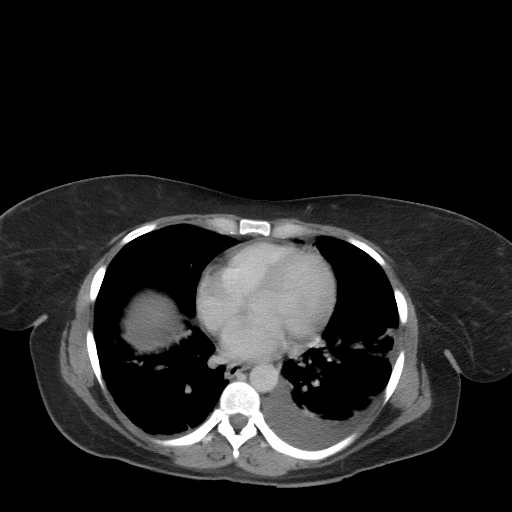

[Series 5: coronal st · coronal · 0.88mm/px · 3 of 97 slices shown]
[im 33/97  soft-tissue]
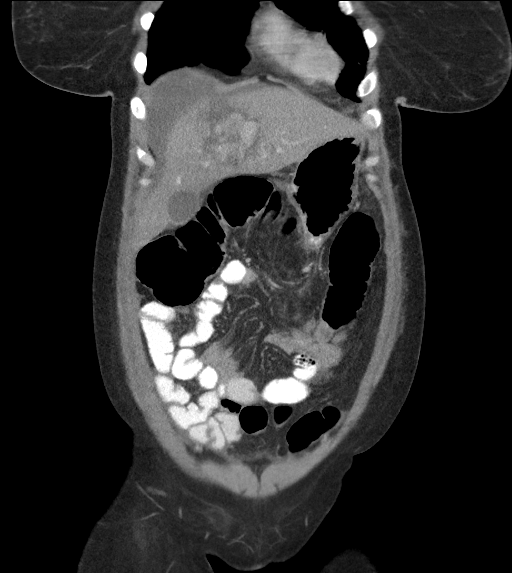
[im 43/97  soft-tissue]
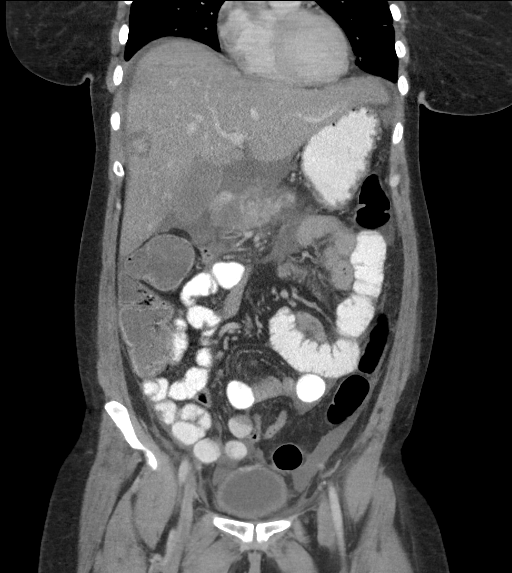
[im 54/97  soft-tissue]
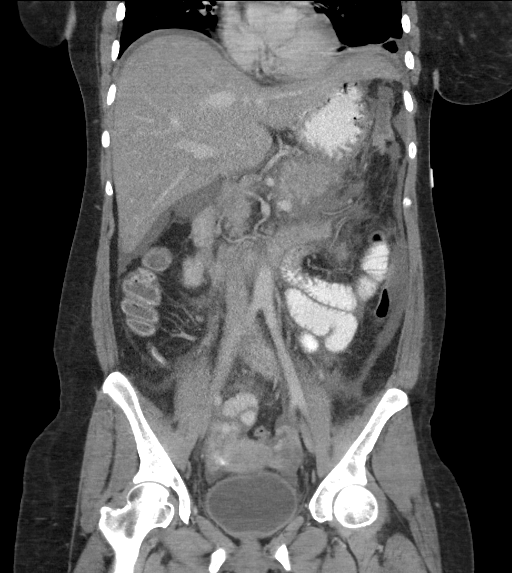

[16 of 46 positions shown; findings below may reference images not displayed]

FINDINGS: Lower chest: Small left and trace right pleural effusions. Patchy
left lower lobe opacity, likely atelectasis, less likely pneumonia.
Additional linear ground-glass opacities in the lingula and right
lower lobe, likely atelectasis.

Hepatobiliary: 4.9 x 4.4 cm heterogeneously enhancing lesion in
segment 4, along the falciform ligament (series 2/image 25).
Additional 2.1 x 2.5 cm heterogeneously enhancing lesion in segment
8 (series 2/image 26). These are not characteristic of hemangiomas
and may reflect hepatic adenomas or less likely FNH.

Gallbladder is unremarkable. No intrahepatic or extrahepatic ductal
dilatation.

Pancreas: Acute pancreatitis. A small area of hypoenhancement is
present in the proximal pancreatic tail (series 2/image 36),
worrisome for pancreatic necrosis. Peripancreatic fluid,
predominantly along the pancreatic tail, without walled-off
necrosis/acute pancreatic collection.

Spleen: Within normal limits.

Adrenals/Urinary Tract: Adrenal glands are within normal limits.

Kidneys are within normal limits.  No hydronephrosis.

Mildly thick-walled bladder, although underdistended.

Stomach/Bowel: Stomach is within normal limits.

No evidence of bowel obstruction.

Normal appendix (series 2/image 60).

Vascular/Lymphatic: No evidence of abdominal aortic aneurysm.

No suspicious abdominopelvic lymphadenopathy.

Reproductive: Uterus is within normal limits.

Bilateral ovaries are unremarkable.

Other: Small volume abdominopelvic ascites.

Musculoskeletal: Visualized osseous structures are within normal
limits.
IMPRESSION: Acute pancreatitis with possible focal area of pancreatic necrosis
in the proximal pancreatic tail.

Associated peripancreatic fluid without walled-off necrosis/acute
pancreatic collection.

Small left and trace right pleural effusions. Patchy left lower lobe
opacity, likely atelectasis, less likely pneumonia.

Two liver lesions measuring up to 4.9 cm, as described, favoring
benign hepatic adenomas over FNH or hemangiomas. Consider follow-up
MRI abdomen with/without contrast in 3 months for further
evaluation. A hepatobiliary specific agent (Eovist) is suggested at
that time.

## 2021-12-23 ENCOUNTER — Emergency Department (HOSPITAL_COMMUNITY)
Admission: EM | Admit: 2021-12-23 | Discharge: 2021-12-23 | Disposition: A | Discharge status: Home / Self Care | Payer: Self-pay | Attending: Emergency Medicine | Admitting: Emergency Medicine

## 2021-12-23 ENCOUNTER — Other Ambulatory Visit: Payer: Self-pay

## 2021-12-23 ENCOUNTER — Encounter (HOSPITAL_COMMUNITY): Payer: Self-pay | Admitting: Emergency Medicine

## 2021-12-23 DIAGNOSIS — R112 Nausea with vomiting, unspecified: Secondary | ICD-10-CM | POA: Insufficient documentation

## 2021-12-23 DIAGNOSIS — N3001 Acute cystitis with hematuria: Secondary | ICD-10-CM | POA: Insufficient documentation

## 2021-12-23 DIAGNOSIS — Z91040 Latex allergy status: Secondary | ICD-10-CM | POA: Insufficient documentation

## 2021-12-23 DIAGNOSIS — Z79899 Other long term (current) drug therapy: Secondary | ICD-10-CM | POA: Insufficient documentation

## 2021-12-23 HISTORY — DX: Acute pancreatitis without necrosis or infection, unspecified: K85.90

## 2021-12-23 LAB — CBC WITH DIFFERENTIAL/PLATELET
Abs Immature Granulocytes: 0.02 10*3/uL (ref 0.00–0.07)
Basophils Absolute: 0 10*3/uL (ref 0.0–0.1)
Basophils Relative: 0 %
Eosinophils Absolute: 0 10*3/uL (ref 0.0–0.5)
Eosinophils Relative: 0 %
HCT: 41.1 % (ref 36.0–46.0)
Hemoglobin: 13.8 g/dL (ref 12.0–15.0)
Immature Granulocytes: 0 %
Lymphocytes Relative: 14 %
Lymphs Abs: 1.1 10*3/uL (ref 0.7–4.0)
MCH: 30.5 pg (ref 26.0–34.0)
MCHC: 33.6 g/dL (ref 30.0–36.0)
MCV: 90.9 fL (ref 80.0–100.0)
Monocytes Absolute: 0.7 10*3/uL (ref 0.1–1.0)
Monocytes Relative: 9 %
Neutro Abs: 6 10*3/uL (ref 1.7–7.7)
Neutrophils Relative %: 77 %
Platelets: 271 10*3/uL (ref 150–400)
RBC: 4.52 MIL/uL (ref 3.87–5.11)
RDW: 13.3 % (ref 11.5–15.5)
WBC: 7.8 10*3/uL (ref 4.0–10.5)
nRBC: 0 % (ref 0.0–0.2)

## 2021-12-23 LAB — COMPREHENSIVE METABOLIC PANEL
ALT: 8 U/L (ref 0–44)
AST: 9 U/L — ABNORMAL LOW (ref 15–41)
Albumin: 3.7 g/dL (ref 3.5–5.0)
Alkaline Phosphatase: 55 U/L (ref 38–126)
Anion gap: 11 (ref 5–15)
BUN: 20 mg/dL (ref 6–20)
CO2: 25 mmol/L (ref 22–32)
Calcium: 9.3 mg/dL (ref 8.9–10.3)
Chloride: 99 mmol/L (ref 98–111)
Creatinine, Ser: 0.89 mg/dL (ref 0.44–1.00)
GFR, Estimated: >60 mL/min (ref >60)
Glucose, Bld: 104 mg/dL — ABNORMAL HIGH (ref 70–99)
Potassium: 3.5 mmol/L (ref 3.5–5.1)
Sodium: 135 mmol/L (ref 135–145)
Total Bilirubin: 0.8 mg/dL (ref 0.3–1.2)
Total Protein: 7.4 g/dL (ref 6.5–8.1)

## 2021-12-23 LAB — URINALYSIS, ROUTINE W REFLEX MICROSCOPIC
Bilirubin Urine: NEGATIVE
Glucose, UA: 50 mg/dL — AB
Ketones, ur: 80 mg/dL — AB
Leukocytes,Ua: NEGATIVE
Nitrite: NEGATIVE
Protein, ur: >=300 mg/dL — AB
Specific Gravity, Urine: 1.034 — ABNORMAL HIGH (ref 1.005–1.030)
pH: 5 (ref 5.0–8.0)

## 2021-12-23 LAB — LIPASE, BLOOD: Lipase: 50 U/L (ref 11–51)

## 2021-12-23 LAB — PREGNANCY, URINE: Preg Test, Ur: NEGATIVE

## 2021-12-23 MED ORDER — METOCLOPRAMIDE HCL 5 MG/ML IJ SOLN
10.0000 mg | Freq: Once | INTRAMUSCULAR | Status: AC
  Administered 2021-12-23: 10 mg via INTRAVENOUS
  Filled 2021-12-23: qty 2

## 2021-12-23 MED ORDER — KETOROLAC TROMETHAMINE 15 MG/ML IJ SOLN
15.0000 mg | Freq: Once | INTRAMUSCULAR | Status: AC
  Administered 2021-12-23: 15 mg via INTRAVENOUS
  Filled 2021-12-23: qty 1

## 2021-12-23 MED ORDER — CEPHALEXIN 500 MG PO CAPS: 500.0000 mg | ORAL_CAPSULE | Freq: Two times a day (BID) | ORAL | 0 refills | Status: AC

## 2021-12-23 MED ORDER — SODIUM CHLORIDE 0.9 % IV BOLUS
1000.0000 mL | Freq: Once | INTRAVENOUS | Status: AC
  Administered 2021-12-23: 1000 mL via INTRAVENOUS

## 2021-12-23 MED ORDER — ONDANSETRON HCL 4 MG/2ML IJ SOLN
4.0000 mg | Freq: Once | INTRAMUSCULAR | Status: AC
  Administered 2021-12-23: 4 mg via INTRAVENOUS
  Filled 2021-12-23: qty 2

## 2021-12-23 MED ORDER — ONDANSETRON HCL 4 MG PO TABS: 4.0000 mg | ORAL_TABLET | Freq: Four times a day (QID) | ORAL | 0 refills | Status: DC

## 2021-12-23 MED ORDER — FAMOTIDINE IN NACL 20-0.9 MG/50ML-% IV SOLN
20.0000 mg | Freq: Once | INTRAVENOUS | Status: AC
  Administered 2021-12-23: 20 mg via INTRAVENOUS
  Filled 2021-12-23: qty 50

## 2021-12-23 NOTE — ED Triage Notes (Signed)
Pt reports back pain and emesis x 4 days  ?

## 2021-12-23 NOTE — Discharge Instructions (Addendum)
You were given a prescription for zofran to help with your nausea. Please take as directed. ? ?You were given a prescription for antibiotics. Please take the antibiotic prescription fully.  ? ?Please follow up with your primary doctor within the next 5-7 days.  If you do not have a primary care provider, information for a healthcare clinic has been provided for you to make arrangements for follow up care. Please return to the ER sooner if you have any new or worsening symptoms, or if you have any of the following symptoms: ? ?Abdominal pain that does not go away.  ?You have a fever.  ?You keep throwing up (vomiting).  ?The pain is felt only in portions of the abdomen. Pain in the right side could possibly be appendicitis. In an adult, pain in the left lower portion of the abdomen could be colitis or diverticulitis.  ?You pass bloody or black tarry stools.  ?There is bright red blood in the stool.  ?The constipation stays for more than 4 days.  ?There is belly (abdominal) or rectal pain.  ?You do not seem to be getting better.  ?You have any questions or concerns.  ? ?

## 2021-12-23 NOTE — ED Provider Notes (Signed)
?[facility] ?Provider Note ? ? ?CSN: [id number] ?Arrival date & time: 12/23/21  1144 ? ?  ? ?History ? ?Chief Complaint  ?Patient presents with  ? Back Pain  ? ? ?Holly Joseph is a 36 y.o. female. ? ?HPI ? ?36 year old female with a history of BV, pancreatitis, EtOH abuse, who presents to the emergency department today for evaluation of nausea and vomiting that started 4 days ago.  She states symptoms feel similar to when she has had pancreatitis in the past.  In the past this is alcohol induced that she denies any EtOH use recently.  She denies any abdominal pain but does have some aching back pain that is typical for her with pancreatitis.  She denies any associated fevers, diarrhea, constipation, bloody stools, urinary symptoms. ? ?Home Medications ?Prior to Admission medications   ?Medication Sig Start Date End Date Taking? Authorizing Provider  ?cephALEXin (KEFLEX) 500 MG capsule Take 1 capsule (500 mg total) by mouth 2 (two) times daily for 7 days. 12/23/21 12/30/21 Yes Franco Duley S, PA-C  ?folic acid (FOLVITE) 400 MCG tablet Take 400 mcg by mouth daily.   Yes [provider]  ?ondansetron (ZOFRAN) 4 MG tablet Take 1 tablet (4 mg total) by mouth every 6 (six) hours. 12/23/21  Yes Margues Filippini S, PA-C  ?amLODipine (NORVASC) 5 MG tablet Take 1 tablet (5 mg total) by mouth daily. 12/31/19 12/30/20  Cleora FleetJohnson, Clanford L, MD  ?Multiple Vitamin (MULTIVITAMIN WITH MINERALS) TABS tablet Take 1 tablet by mouth daily. ?Patient not taking: Reported on 12/23/2021 12/31/19   Cleora FleetJohnson, Clanford L, MD  ?Omeprazole 20 MG TBEC Take 1 tablet (20 mg total) by mouth daily with breakfast. ?Patient not taking: Reported on 12/23/2021 12/31/19   Cleora FleetJohnson, Clanford L, MD  ?thiamine 100 MG tablet Take 1 tablet (100 mg total) by mouth daily. ?Patient not taking: Reported on 12/23/2021 12/31/19   Cleora FleetJohnson, Clanford L, MD  ?   ? ?Allergies    ?Latex   ? ?Review of Systems   ?Review of Systems ?See HPI for pertinent  positives or negatives. ? ? ?Physical Exam ?Updated Vital Signs ?BP 139/77   Pulse 60   Temp 97.7 ?F (36.5 ?C) (Oral)   Resp 17   Ht 5\' 4"  (1.626 m)   Wt 76.2 kg   LMP 12/23/2021 (Exact Date)   SpO2 96%   BMI 28.84 kg/m?  ?Physical Exam ?Vitals and nursing note reviewed.  ?Constitutional:   ?   General: She is not in acute distress. ?   Appearance: She is well-developed.  ?HENT:  ?   Head: Normocephalic and atraumatic.  ?Eyes:  ?   Conjunctiva/sclera: Conjunctivae normal.  ?Cardiovascular:  ?   Rate and Rhythm: Normal rate and regular rhythm.  ?   Heart sounds: Normal heart sounds. No murmur heard. ?Pulmonary:  ?   Effort: Pulmonary effort is normal. No respiratory distress.  ?   Breath sounds: Normal breath sounds. No wheezing, rhonchi or rales.  ?Abdominal:  ?   General: Abdomen is flat. Bowel sounds are normal.  ?   Palpations: Abdomen is soft.  ?   Tenderness: There is abdominal tenderness in the epigastric area.  ?Musculoskeletal:     ?   General: No swelling.  ?   Cervical back: Neck supple.  ?Skin: ?   General: Skin is warm and dry.  ?   Capillary Refill: Capillary refill takes less than 2 seconds.  ?Neurological:  ?   Mental  Status: She is alert.  ?Psychiatric:     ?   Mood and Affect: Mood normal.  ? ? ?ED Results / Procedures / Treatments   ?Labs ?(all labs ordered are listed, but only abnormal results are displayed) ?Labs Reviewed  ?COMPREHENSIVE METABOLIC PANEL - Abnormal; Notable for the following components:  ?    Result Value  ? Glucose, Bld 104 (*)   ? AST 9 (*)   ? All other components within normal limits  ?URINALYSIS, ROUTINE W REFLEX MICROSCOPIC - Abnormal; Notable for the following components:  ? Color, Urine AMBER (*)   ? Specific Gravity, Urine 1.034 (*)   ? Glucose, UA 50 (*)   ? Hgb urine dipstick MODERATE (*)   ? Ketones, ur 80 (*)   ? Protein, ur >=300 (*)   ? Bacteria, UA RARE (*)   ? All other components within normal limits  ?CBC WITH DIFFERENTIAL/PLATELET  ?LIPASE, BLOOD   ?PREGNANCY, URINE  ?I-STAT BETA HCG BLOOD, ED ([facility], [facility], [facility] ONLY)  ? ? ?EKG ?None ? ?Radiology ?No results found. ? ?Procedures ?Procedures  ? ? ?Medications Ordered in ED ?Medications  ?sodium chloride 0.9 % bolus 1,000 mL (0 mLs Intravenous Stopped 12/23/21 1701)  ?ondansetron (ZOFRAN) injection 4 mg (4 mg Intravenous Given 12/23/21 1541)  ?famotidine (PEPCID) IVPB 20 mg premix (0 mg Intravenous Stopped 12/23/21 1621)  ?ketorolac (TORADOL) 15 MG/ML injection 15 mg (15 mg Intravenous Given 12/23/21 1659)  ?metoCLOPramide (REGLAN) injection 10 mg (10 mg Intravenous Given 12/23/21 1728)  ? ? ?ED Course/ Medical Decision Making/ A&P ?  ?                        ?Medical Decision Making ?Amount and/or Complexity of Data Reviewed ?Labs: ordered. ? ?Risk ?Prescription drug management. ? ? ?This patient presents to the ED for concern of nv, this involves an extensive number of treatment options, and is a complaint that carries with it a high risk of complications and morbidity.  The differential diagnosis includes but is not limited to gastritis/PUD, enteritis/duodenitis, appendicitis, cholelithiasis/cholecystitis, cholangitis, pancreatitis, ruptured viscus, colitis, diverticulitis, proctitis, cystitis, pyelonephritis, ureteral colic, aortic dissection, aortic aneurysm. In women, ectopic pregnancy, pelvic inflammatory disease, ovarian cysts, and tubo-ovarian abscess were also considered. Atypical chest etiologies were also considered including ACS, PE, and pneumonia.  ?  ? ?Comorbidities that complicate the patient evaluation: ?Patient?s presentation is complicated by their history of pancreatitis, etoh use ? ?Additional history obtained: ?Records reviewed previous admission documents ? ?Lab Tests: ?I Ordered, and personally interpreted labs.  The pertinent results include:   ?CBC wnl ?CMP unremarkable ?Lipase negative ?UA with hematuria, ketonuria, proteinuria, 21-50 rbc, 610 wbc and rare bacteria ?Urine preg neg ? ? ?I agree  with the radiologist interpretation ? ?Medicines ordered and prescription drug management: ?I ordered medication including Pepcid, Zofran, Toradol, Reglan and IV fluids for pain, nausea, dehydration ?Reevaluation of the patient after these medicines showed that the patient    improved ? ?Critical Interventions: Pepcid, Zofran, Toradol, Reglan, IV fluids ? ?Complexity of problems addressed: ?Patient?s presentation is most consistent with   ? ?Disposition: ?After consideration of the diagnostic results and the patient?s response to treatment,  ?I feel that the patent would benefit from discharge home .  Patient here with nausea vomiting for the last several days.  Abdomen soft and nontender on reevaluation.  Patient has had improvement of her symptoms with intervention in the emergency department.  Her labs are completely  benign.  I have low suspicion for any emergent process at this time other than the uncomplicated urinary tract infection was diagnosed today UA.  Have started on antibiotics and will also send her home with Zofran.  We will have her follow-up with her PCP and return to the ED for any new or worsening symptoms.  She voices understanding of the plan and reasons to return.  All questions answered.  Patient stable for discharge. ? ? ?Final Clinical Impression(s) / ED Diagnoses ?Final diagnoses:  ?Nausea and vomiting, unspecified vomiting type  ?Acute cystitis with hematuria  ? ? ?Rx / DC Orders ?ED Discharge Orders   ? ?      Ordered  ?  ondansetron (ZOFRAN) 4 MG tablet  Every 6 hours       ? 12/23/21 1849  ?  cephALEXin (KEFLEX) 500 MG capsule  2 times daily       ? 12/23/21 1849  ? ?  ?  ? ?  ? ? ?  ?Samson Frederic, Mikel Pyon S, PA-C ?12/23/21 2018 ? ?  ?Vanetta Mulders, MD ?12/24/21 0730 ? ?

## 2022-03-26 ENCOUNTER — Encounter: Payer: Self-pay | Admitting: Adult Health

## 2022-03-26 ENCOUNTER — Other Ambulatory Visit (HOSPITAL_COMMUNITY)
Admission: RE | Admit: 2022-03-26 | Discharge: 2022-03-26 | Disposition: A | Discharge status: Home / Self Care | Payer: BC Managed Care – PPO | Source: Ambulatory Visit | Attending: Adult Health | Admitting: Adult Health

## 2022-03-26 ENCOUNTER — Ambulatory Visit (INDEPENDENT_AMBULATORY_CARE_PROVIDER_SITE_OTHER): Payer: BC Managed Care – PPO | Admitting: Adult Health

## 2022-03-26 VITALS — BP 137/88 | HR 73 | Ht 63.25 in | Wt 142.5 lb

## 2022-03-26 DIAGNOSIS — Z720 Tobacco use: Secondary | ICD-10-CM

## 2022-03-26 DIAGNOSIS — Z3009 Encounter for other general counseling and advice on contraception: Secondary | ICD-10-CM | POA: Insufficient documentation

## 2022-03-26 DIAGNOSIS — Z01419 Encounter for gynecological examination (general) (routine) without abnormal findings: Secondary | ICD-10-CM

## 2022-03-26 DIAGNOSIS — Z113 Encounter for screening for infections with a predominantly sexual mode of transmission: Secondary | ICD-10-CM

## 2022-03-26 MED ORDER — VALACYCLOVIR HCL 1 G PO TABS: 1000.0000 mg | ORAL_TABLET | Freq: Two times a day (BID) | ORAL | 3 refills | Status: DC

## 2022-03-26 NOTE — Progress Notes (Signed)
Patient ID: Holly Joseph, female   DOB: [birth year], 36 y.o.   MRN: [id number] History of Present Illness: Holly Joseph is a 36 year old black female,divorced, G2P1102 in for a well woman gyn exam and pap. She requests STD testing. She also requests rx for valtrex for herpes. NO PCP currently  Current Medications, Allergies, Past Medical History, Past Surgical History, Family History and Social History were reviewed in [employer] record.     Review of Systems: Patient denies any headaches, hearing loss, fatigue, blurred vision, shortness of breath, chest pain, abdominal pain, problems with bowel movements, urination, or intercourse. No joint pain or mood swings.     Physical Exam:BP 137/88 (BP Location: Left Arm, Patient Position: Sitting, Cuff Size: Normal)   Pulse 73   Ht 5' 3.25" (1.607 m)   Wt 142 lb 8 oz (64.6 kg)   LMP 03/18/2022 (Approximate)   BMI 25.04 kg/m   General:  Well developed, well nourished, no acute distress Skin:  Warm and dry Neck:  Midline trachea, normal thyroid, good ROM, no lymphadenopathy Lungs; Clear to auscultation bilaterally Breast:  No dominant palpable mass, retraction, or nipple discharge Cardiovascular: Regular rate and rhythm Abdomen:  Soft, non tender, no hepatosplenomegaly Pelvic:  External genitalia is normal in appearance, no lesions.  The vagina is normal in appearance. Urethra has no lesions or masses. The cervix is bulbous. Pap with GC/CHL and HR HPV genotyping performed. Uterus is felt to be normal size, shape, and contour.  No adnexal masses or tenderness noted.Bladder is non tender, no masses felt. Extremities/musculoskeletal:  No swelling or varicosities noted, no clubbing or cyanosis Psych:  No mood changes, alert and cooperative,seems happy AA is 3 Fall risk is low    03/26/2022   11:21 AM  Depression screen PHQ 2/9  Decreased Interest 0  Down, Depressed, Hopeless 0  PHQ - 2 Score 0  Altered sleeping 0   Tired, decreased energy 0  Change in appetite 0  Feeling bad or failure about yourself  0  Trouble concentrating 0  Moving slowly or fidgety/restless 0  Suicidal thoughts 0  PHQ-9 Score 0       03/26/2022   11:21 AM  GAD 7 : Generalized Anxiety Score  Nervous, Anxious, on Edge 1  Control/stop worrying 1  Worry too much - different things 1  Trouble relaxing 1  Restless 1  Easily annoyed or irritable 1  Afraid - awful might happen 0  Total GAD 7 Score 6      Upstream - 03/26/22 1126       Pregnancy Intention Screening   Does the patient want to become pregnant in the next year? No    Does the patient's partner want to become pregnant in the next year? No    Would the patient like to discuss contraceptive options today? No      Contraception Wrap Up   Current Method Female Sterilization    End Method Female Sterilization            Examination chaperoned by Malachy Mood LPN  Impression and Plan: 1. Encounter for gynecological examination with Papanicolaou smear of cervix Pap sent Pap in 3 years if normal Physical in 1 year Will check labs  - Cytology - PAP( [facility]) - CBC - Comprehensive metabolic panel - TSH - Lipid panel Will rx valtrex Meds ordered this encounter  Medications   valACYclovir (VALTREX) 1000 MG tablet    Sig: Take 1 tablet (1,000 mg  total) by mouth 2 (two) times daily.    Dispense:  20 tablet    Refill:  3    Order Specific Question:   Supervising Provider    Answer:   Despina Hidden, LUTHER H [[id number]]    2. Screening examination for STD (sexually transmitted disease) GC/CHL sent on pap Will check labs - Hepatitis C antibody - Hepatitis B surface antigen - HIV Antibody (routine testing w rflx) - RPR  3. Nicotine abuse Discussed trying nicotine gum and call [phone] when she is ready

## 2022-03-27 LAB — COMPREHENSIVE METABOLIC PANEL
ALT: 12 IU/L (ref 0–32)
AST: 12 IU/L (ref 0–40)
Albumin/Globulin Ratio: 2 (ref 1.2–2.2)
Albumin: 4.4 g/dL (ref 3.8–4.8)
Alkaline Phosphatase: 63 IU/L (ref 44–121)
BUN/Creatinine Ratio: 12 (ref 9–23)
BUN: 10 mg/dL (ref 6–20)
Bilirubin Total: 0.4 mg/dL (ref 0.0–1.2)
CO2: 23 mmol/L (ref 20–29)
Calcium: 9.8 mg/dL (ref 8.7–10.2)
Chloride: 105 mmol/L (ref 96–106)
Creatinine, Ser: 0.85 mg/dL (ref 0.57–1.00)
Globulin, Total: 2.2 g/dL (ref 1.5–4.5)
Glucose: 84 mg/dL (ref 70–99)
Potassium: 4.5 mmol/L (ref 3.5–5.2)
Sodium: 142 mmol/L (ref 134–144)
Total Protein: 6.6 g/dL (ref 6.0–8.5)
eGFR: 92 mL/min/{1.73_m2} (ref >59)

## 2022-03-27 LAB — HEPATITIS C ANTIBODY: Hep C Virus Ab: NONREACTIVE

## 2022-03-27 LAB — LIPID PANEL
Chol/HDL Ratio: 3 ratio (ref 0.0–4.4)
Cholesterol, Total: 180 mg/dL (ref 100–199)
HDL: 60 mg/dL (ref >39)
LDL Chol Calc (NIH): 103 mg/dL — ABNORMAL HIGH (ref 0–99)
Triglycerides: 94 mg/dL (ref 0–149)
VLDL Cholesterol Cal: 17 mg/dL (ref 5–40)

## 2022-03-27 LAB — HEPATITIS B SURFACE ANTIGEN: Hepatitis B Surface Ag: NEGATIVE

## 2022-03-27 LAB — CBC
Hematocrit: 41.1 % (ref 34.0–46.6)
Hemoglobin: 13.6 g/dL (ref 11.1–15.9)
MCH: 30.1 pg (ref 26.6–33.0)
MCHC: 33.1 g/dL (ref 31.5–35.7)
MCV: 91 fL (ref 79–97)
Platelets: 273 10*3/uL (ref 150–450)
RBC: 4.52 x10E6/uL (ref 3.77–5.28)
RDW: 13.1 % (ref 11.7–15.4)
WBC: 4.4 10*3/uL (ref 3.4–10.8)

## 2022-03-27 LAB — TSH: TSH: 0.508 u[IU]/mL (ref 0.450–4.500)

## 2022-03-27 LAB — HIV ANTIBODY (ROUTINE TESTING W REFLEX): HIV Screen 4th Generation wRfx: NONREACTIVE

## 2022-03-27 LAB — RPR: RPR Ser Ql: NONREACTIVE

## 2022-03-28 LAB — CYTOLOGY - PAP
Chlamydia: NEGATIVE
Comment: NEGATIVE
Comment: NEGATIVE
Comment: NEGATIVE
Comment: NEGATIVE
Comment: NORMAL
Diagnosis: HIGH — AB
HPV 16: POSITIVE — AB
HPV 18 / 45: NEGATIVE
High risk HPV: POSITIVE — AB
Neisseria Gonorrhea: NEGATIVE

## 2022-04-02 ENCOUNTER — Encounter: Payer: Self-pay | Admitting: Adult Health

## 2022-04-02 ENCOUNTER — Telehealth: Payer: Self-pay | Admitting: Adult Health

## 2022-04-02 DIAGNOSIS — R87619 Unspecified abnormal cytological findings in specimens from cervix uteri: Secondary | ICD-10-CM | POA: Insufficient documentation

## 2022-04-02 HISTORY — DX: Unspecified abnormal cytological findings in specimens from cervix uteri: R87.619

## 2022-04-02 NOTE — Telephone Encounter (Signed)
Left message to call me about pap was abnormal, needs colposcopy please call me.

## 2022-04-03 ENCOUNTER — Telehealth: Payer: Self-pay | Admitting: Adult Health

## 2022-04-03 NOTE — Telephone Encounter (Signed)
Pt aware of pap and need for colpo, appt made. She could not get on mychart

## 2022-04-08 ENCOUNTER — Encounter: Payer: Self-pay | Admitting: Obstetrics & Gynecology

## 2022-04-08 ENCOUNTER — Ambulatory Visit (INDEPENDENT_AMBULATORY_CARE_PROVIDER_SITE_OTHER): Payer: BC Managed Care – PPO | Admitting: Obstetrics & Gynecology

## 2022-04-08 VITALS — BP 160/93 | HR 84 | Ht 63.25 in | Wt 129.0 lb

## 2022-04-08 DIAGNOSIS — R87611 Atypical squamous cells cannot exclude high grade squamous intraepithelial lesion on cytologic smear of cervix (ASC-H): Secondary | ICD-10-CM | POA: Diagnosis not present

## 2022-04-08 DIAGNOSIS — Z3202 Encounter for pregnancy test, result negative: Secondary | ICD-10-CM

## 2022-04-08 LAB — POCT URINE PREGNANCY: Preg Test, Ur: NEGATIVE

## 2022-04-08 MED ORDER — RIZATRIPTAN BENZOATE 5 MG PO TABS: 5.0000 mg | ORAL_TABLET | ORAL | 0 refills | Status: DC | PRN

## 2022-04-08 MED ORDER — PROMETHAZINE HCL 25 MG PO TABS: 25.0000 mg | ORAL_TABLET | Freq: Four times a day (QID) | ORAL | 1 refills | Status: DC | PRN

## 2022-04-08 NOTE — Progress Notes (Signed)
    Colposcopy Procedure Note:    Colposcopy Procedure Note  Indications:  ASC-H +HPV 16   2019 ASCCP recommendation:  Smoker:  Yes.   New sexual partner:  Yes.    : time frame:  No.  History of abnormal Pap: no  Procedure Details  The risks and benefits of the procedure and Written informed consent obtained.  Speculum placed in vagina and excellent visualization of cervix achieved, cervix swabbed x 3 with acetic acid solution.  Findings: Adequate colposcopy is noted today.  Cervix: no visible lesions, no mosaicism, no punctation, and no abnormal vasculature; SCJ visualized 360 degrees without lesions and no biopsies taken. Vaginal inspection: vaginal colposcopy not performed. Vulvar colposcopy: vulvar colposcopy not performed.  Specimens: none  Complications: none.  Colposcopic Impression: Mild AWE changes, no punctation or mosaicism, no biopsy indicated  Plan(Based on 2019 ASCCP recommendations) Repeat HPV based cytology 1 year

## 2022-04-09 ENCOUNTER — Encounter: Payer: Self-pay | Admitting: Obstetrics & Gynecology

## 2023-06-03 ENCOUNTER — Encounter: Payer: Self-pay | Admitting: Adult Health

## 2023-06-03 ENCOUNTER — Ambulatory Visit (INDEPENDENT_AMBULATORY_CARE_PROVIDER_SITE_OTHER): Payer: BC Managed Care – PPO | Admitting: Adult Health

## 2023-06-03 ENCOUNTER — Other Ambulatory Visit (HOSPITAL_COMMUNITY)
Admission: RE | Admit: 2023-06-03 | Discharge: 2023-06-03 | Disposition: A | Discharge status: Home / Self Care | Payer: BC Managed Care – PPO | Source: Ambulatory Visit | Attending: Adult Health | Admitting: Adult Health

## 2023-06-03 VITALS — BP 128/77 | HR 56 | Ht 63.5 in | Wt 133.0 lb

## 2023-06-03 DIAGNOSIS — Z8742 Personal history of other diseases of the female genital tract: Secondary | ICD-10-CM | POA: Insufficient documentation

## 2023-06-03 DIAGNOSIS — Z13220 Encounter for screening for lipoid disorders: Secondary | ICD-10-CM | POA: Insufficient documentation

## 2023-06-03 DIAGNOSIS — Z01419 Encounter for gynecological examination (general) (routine) without abnormal findings: Secondary | ICD-10-CM | POA: Diagnosis not present

## 2023-06-03 DIAGNOSIS — Z1329 Encounter for screening for other suspected endocrine disorder: Secondary | ICD-10-CM | POA: Diagnosis not present

## 2023-06-03 NOTE — Progress Notes (Signed)
Patient ID: Holly Joseph, female   DOB: 1986-08-01, 37 y.o.   MRN: [id number] History of Present Illness: Holly Joseph is a 37 year old black female, married, G2P1102 in for a well woman gyn exam and pap. She had colpo 04/08/22 but no biopsy. Last pap was ASC-H +HPV 16 and +HPV.     Current Medications, Allergies, Past Medical History, Past Surgical History, Family History and Social History were reviewed in [employer] record.     Review of Systems: Patient denies any headaches, hearing loss, fatigue, blurred vision, shortness of breath, chest pain, abdominal pain, problems with bowel movements, urination, or intercourse. No joint pain or mood swings.  Feels ?knot on cervix for years.   Physical Exam:BP 128/77 (BP Location: Left Arm, Patient Position: Sitting, Cuff Size: Normal)   Pulse (!) 56   Ht 5' 3.5" (1.613 m)   Wt 133 lb (60.3 kg)   LMP 05/31/2023   BMI 23.19 kg/m   General:  Well developed, well nourished, no acute distress Skin:  Warm and dry Neck:  Midline trachea, normal thyroid, good ROM, no lymphadenopathy Lungs; Clear to auscultation bilaterally Breast:  No dominant palpable mass, retraction, or nipple discharge Cardiovascular: Regular rate and rhythm Abdomen:  Soft, non tender, no hepatosplenomegaly Pelvic:  External genitalia is normal in appearance, no lesions.  The vagina is normal in appearance. Urethra has no lesions or masses. The cervix is bulbous, irregular at os, pap with GC/CHL and  HR HPV genotyping performed.  Uterus is felt to be normal size, shape, and contour.  No adnexal masses or tenderness noted.Bladder is non tender, no masses felt. Extremities/musculoskeletal:  No swelling or varicosities noted, no clubbing or cyanosis Psych:  No mood changes, alert and cooperative,seems happy AA is 0 Fall risk is low    06/03/2023    3:13 PM 03/26/2022   11:21 AM  Depression screen PHQ 2/9  Decreased Interest 0 0  Down, Depressed, Hopeless  0 0  PHQ - 2 Score 0 0  Altered sleeping 1 0  Tired, decreased energy 1 0  Change in appetite 0 0  Feeling bad or failure about yourself  0 0  Trouble concentrating 0 0  Moving slowly or fidgety/restless 0 0  Suicidal thoughts 0 0  PHQ-9 Score 2 0       06/03/2023    3:13 PM 03/26/2022   11:21 AM  GAD 7 : Generalized Anxiety Score  Nervous, Anxious, on Edge 1 1  Control/stop worrying 1 1  Worry too much - different things 1 1  Trouble relaxing 1 1  Restless 0 1  Easily annoyed or irritable 1 1  Afraid - awful might happen 0 0  Total GAD 7 Score 5 6    Upstream - 06/03/23 1517       Pregnancy Intention Screening   Does the patient want to become pregnant in the next year? No    Does the patient's partner want to become pregnant in the next year? No    Would the patient like to discuss contraceptive options today? No      Contraception Wrap Up   Current Method Female Sterilization    End Method Female Sterilization    Contraception Counseling Provided No            Examination chaperoned by Malachy Mood LPN     Impression and Plan: 1. Encounter for gynecological examination with Papanicolaou smear of cervix Pap sent Physical in 1 year Will  check labs at her request  - Cytology - PAP( [place]) - CBC - Comprehensive metabolic panel - Lipid panel  2. History of abnormal cervical Pap smear Pap sent   3. Screening cholesterol level  - Lipid panel  4. Screening for thyroid disorder - TSH + free T4

## 2023-06-04 LAB — COMPREHENSIVE METABOLIC PANEL
ALT: 16 IU/L (ref 0–32)
AST: 19 IU/L (ref 0–40)
Albumin: 4.3 g/dL (ref 3.9–4.9)
Alkaline Phosphatase: 61 IU/L (ref 44–121)
BUN/Creatinine Ratio: 15 (ref 9–23)
BUN: 12 mg/dL (ref 6–20)
Bilirubin Total: 0.6 mg/dL (ref 0.0–1.2)
CO2: 17 mmol/L — ABNORMAL LOW (ref 20–29)
Calcium: 9.5 mg/dL (ref 8.7–10.2)
Chloride: 103 mmol/L (ref 96–106)
Creatinine, Ser: 0.82 mg/dL (ref 0.57–1.00)
Globulin, Total: 2.6 g/dL (ref 1.5–4.5)
Glucose: 89 mg/dL (ref 70–99)
Potassium: 3.9 mmol/L (ref 3.5–5.2)
Sodium: 140 mmol/L (ref 134–144)
Total Protein: 6.9 g/dL (ref 6.0–8.5)
eGFR: 96 mL/min/{1.73_m2} (ref >59)

## 2023-06-04 LAB — TSH+FREE T4
Free T4: 1.52 ng/dL (ref 0.82–1.77)
TSH: 0.598 u[IU]/mL (ref 0.450–4.500)

## 2023-06-04 LAB — LIPID PANEL
Chol/HDL Ratio: 2.9 ratio (ref 0.0–4.4)
Cholesterol, Total: 194 mg/dL (ref 100–199)
HDL: 68 mg/dL (ref >39)
LDL Chol Calc (NIH): 114 mg/dL — ABNORMAL HIGH (ref 0–99)
Triglycerides: 65 mg/dL (ref 0–149)
VLDL Cholesterol Cal: 12 mg/dL (ref 5–40)

## 2023-06-04 LAB — CBC
Hematocrit: 39.5 % (ref 34.0–46.6)
Hemoglobin: 13.1 g/dL (ref 11.1–15.9)
MCH: 29.9 pg (ref 26.6–33.0)
MCHC: 33.2 g/dL (ref 31.5–35.7)
MCV: 90 fL (ref 79–97)
Platelets: 250 10*3/uL (ref 150–450)
RBC: 4.38 x10E6/uL (ref 3.77–5.28)
RDW: 12.5 % (ref 11.7–15.4)
WBC: 5.2 10*3/uL (ref 3.4–10.8)

## 2023-06-09 LAB — CYTOLOGY - PAP
Chlamydia: NEGATIVE
Comment: NEGATIVE
Comment: NEGATIVE
Comment: NEGATIVE
Comment: NEGATIVE
Comment: NORMAL
Diagnosis: HIGH — AB
HPV 16: POSITIVE — AB
HPV 18 / 45: NEGATIVE
High risk HPV: POSITIVE — AB
Neisseria Gonorrhea: NEGATIVE

## 2023-06-11 ENCOUNTER — Telehealth: Payer: Self-pay | Admitting: Adult Health

## 2023-06-11 NOTE — Telephone Encounter (Signed)
Left message to call about pap, needs colpo appt

## 2023-06-15 ENCOUNTER — Telehealth: Payer: Self-pay

## 2023-06-15 NOTE — Telephone Encounter (Signed)
Left voicemail & mychart message about getting colpo scheduled.

## 2023-06-30 ENCOUNTER — Encounter: Payer: BC Managed Care – PPO | Admitting: Women's Health

## 2023-07-19 ENCOUNTER — Encounter (HOSPITAL_COMMUNITY): Payer: Self-pay | Admitting: Emergency Medicine

## 2023-07-19 ENCOUNTER — Ambulatory Visit (HOSPITAL_COMMUNITY)
Admission: EM | Admit: 2023-07-19 | Discharge: 2023-07-19 | Disposition: A | Discharge status: Home / Self Care | Payer: BC Managed Care – PPO | Attending: Physician Assistant | Admitting: Physician Assistant

## 2023-07-19 DIAGNOSIS — K921 Melena: Secondary | ICD-10-CM

## 2023-07-19 DIAGNOSIS — K6289 Other specified diseases of anus and rectum: Secondary | ICD-10-CM | POA: Diagnosis not present

## 2023-07-19 LAB — CBC WITH DIFFERENTIAL/PLATELET
Abs Immature Granulocytes: 0.01 10*3/uL (ref 0.00–0.07)
Basophils Absolute: 0 10*3/uL (ref 0.0–0.1)
Basophils Relative: 1 %
Eosinophils Absolute: 0.1 10*3/uL (ref 0.0–0.5)
Eosinophils Relative: 2 %
HCT: 42.3 % (ref 36.0–46.0)
Hemoglobin: 13.9 g/dL (ref 12.0–15.0)
Immature Granulocytes: 0 %
Lymphocytes Relative: 24 %
Lymphs Abs: 1.3 10*3/uL (ref 0.7–4.0)
MCH: 29.7 pg (ref 26.0–34.0)
MCHC: 32.9 g/dL (ref 30.0–36.0)
MCV: 90.4 fL (ref 80.0–100.0)
Monocytes Absolute: 0.3 10*3/uL (ref 0.1–1.0)
Monocytes Relative: 6 %
Neutro Abs: 3.7 10*3/uL (ref 1.7–7.7)
Neutrophils Relative %: 67 %
Platelets: 239 10*3/uL (ref 150–400)
RBC: 4.68 MIL/uL (ref 3.87–5.11)
RDW: 14 % (ref 11.5–15.5)
WBC: 5.4 10*3/uL (ref 4.0–10.5)
nRBC: 0 % (ref 0.0–0.2)

## 2023-07-19 LAB — POC HEMOCCULT BLD/STL (OFFICE/1-CARD/DIAGNOSTIC): Fecal Occult Blood, POC: NEGATIVE

## 2023-07-19 MED ORDER — HYDROCORTISONE (PERIANAL) 2.5 % EX CREA: 1.0000 | TOPICAL_CREAM | Freq: Two times a day (BID) | CUTANEOUS | 0 refills | Status: DC

## 2023-07-19 MED ORDER — ACETAMINOPHEN 325 MG PO TABS
975.0000 mg | ORAL_TABLET | Freq: Once | ORAL | Status: AC
  Administered 2023-07-19: 975 mg via ORAL

## 2023-07-19 MED ORDER — ACETAMINOPHEN 325 MG PO TABS
ORAL_TABLET | ORAL | Status: AC
  Filled 2023-07-19: qty 3

## 2023-07-19 MED ORDER — HYDROCORTISONE ACETATE 25 MG RE SUPP: 25.0000 mg | Freq: Two times a day (BID) | RECTAL | 0 refills | Status: DC

## 2023-07-19 NOTE — ED Provider Notes (Signed)
[facility]    CSN: [id number] Arrival date & time: 07/19/23  1029      History   Chief Complaint No chief complaint on file.   HPI Holly Joseph is a 37 y.o. female.   Patient presents today with a 1 month history of ongoing rectal pain.  She reports that currently pain is rated 7 on a 0-10 pain scale but will be as high as a 10 particularly after a bowel movement.  She has tried ibuprofen as well as over-the-counter hemorrhoid cream and suppositories.  These provide temporary relief of symptoms but never completely resolve it.  She has had occasional blood on the toilet tissue but denies any melena or hematochezia.  She denies history of ulcerative colitis or Crohn's disease.  She denies any fever, nausea, vomiting.  She denies previously seeing a Careers adviser or having any kind of hemorrhoid surgery.  She does struggle with constipation and has used laxative which have provided some relief of symptoms.  She is confident that she is not pregnant.    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%   BV (bacterial vaginosis)    Eclamptic seizure    Pancreatitis    Screening for STD (sexually transmitted disease) 12/05/2013    Patient Active Problem List   Diagnosis Date Noted   History of abnormal cervical Pap smear 06/03/2023   Screening for thyroid disorder 06/03/2023   Screening cholesterol level 06/03/2023   Abnormal Pap smear of cervix 04/02/2022   Screening examination for STD (sexually transmitted disease) 03/26/2022   Encounter for gynecological examination with Papanicolaou smear of cervix 03/26/2022   Pancreatic necrosis    Fever    Abnormal CT of the abdomen    ETOH abuse 12/27/2019   Nicotine abuse 12/27/2019   Pancreatitis 12/26/2019   Screening for STD (sexually transmitted disease) 12/05/2013   NSVD (normal  spontaneous vaginal delivery) 06/28/2012   Encounter for sterilization 06/28/2012    Past Surgical History:  Procedure Laterality Date   NO PAST SURGERIES     TUBAL LIGATION  06/28/2012   Procedure: POST PARTUM TUBAL LIGATION;  Surgeon: Tereso Newcomer, MD;  Location: [facility];  Service: Gynecology;  Laterality: Bilateral;  Post partum tubal ligation with filshie clips    OB History     Gravida  2   Para  2   Term  1   Preterm  1   AB  0   Living  2      SAB  0   IAB  0   Ectopic  0   Multiple      Live Births  1            Home Medications    Prior to Admission medications   Medication Sig Start Date End Date Taking? Authorizing Provider  hydrocortisone (ANUSOL-HC) 2.5 % rectal cream Place 1 Application rectally 2 (two) times daily. 07/19/23  Yes Aryannah Mohon, Noberto Retort, PA-C  hydrocortisone (ANUSOL-HC) 25 MG suppository Place 1 suppository (25 mg total) rectally 2 (two) times daily. 07/19/23  Yes Ednah Hammock, Noberto Retort, PA-C    Family History Family History  Problem Relation Age of Onset   Stroke Mother    Diabetes Mother    Hypertension Mother    Cancer Mother        cervical  Diabetes Sister    Cancer Maternal Aunt    Pancreatitis Cousin    Other Neg Hx    Colon cancer Neg Hx     Social History Social History   Tobacco Use   Smoking status: Every Day    Current packs/day: 0.50    Average packs/day: 0.5 packs/day for 18.0 years (9.0 ttl pk-yrs)    Types: Cigarettes   Smokeless tobacco: Never  Vaping Use   Vaping status: Never Used  Substance Use Topics   Alcohol use: Not Currently    Comment: monthly   Drug use: Yes    Types: Marijuana    Comment: weekly     Allergies   Latex   Review of Systems Review of Systems  Constitutional:  Positive for activity change. Negative for appetite change, fatigue and fever.  Gastrointestinal:  Positive for blood in stool, constipation and rectal pain. Negative for abdominal pain, diarrhea, nausea and  vomiting.     Physical Exam Triage Vital Signs ED Triage Vitals  Encounter Vitals Group     BP 07/19/23 1102 (!) 146/102     Systolic BP Percentile --      Diastolic BP Percentile --      Pulse Rate 07/19/23 1102 85     Resp 07/19/23 1102 16     Temp 07/19/23 1102 (!) 97 F (36.1 C)     Temp Source 07/19/23 1102 Oral     SpO2 07/19/23 1102 99 %     Weight --      Height --      Head Circumference --      Peak Flow --      Pain Score 07/19/23 1101 8     Pain Loc --      Pain Education --      Exclude from Growth Chart --    No data found.  Updated Vital Signs BP (!) 146/102 (BP Location: Left Arm)   Pulse 85   Temp (!) 97 F (36.1 C) (Oral) Comment: pt drinking water  Resp 16   LMP 07/03/2023 (Approximate)   SpO2 99%   Visual Acuity Right Eye Distance:   Left Eye Distance:   Bilateral Distance:    Right Eye Near:   Left Eye Near:    Bilateral Near:     Physical Exam Vitals reviewed.  Constitutional:      General: She is awake. She is not in acute distress.    Appearance: Normal appearance. She is well-developed. She is not ill-appearing.     Comments: Very pleasant female appears stated age in no acute distress sitting comfortably in exam room  HENT:     Head: Normocephalic and atraumatic.  Cardiovascular:     Rate and Rhythm: Normal rate and regular rhythm.     Heart sounds: Normal heart sounds, S1 normal and S2 normal. No murmur heard. Pulmonary:     Effort: Pulmonary effort is normal.     Breath sounds: Normal breath sounds. No wheezing, rhonchi or rales.     Comments: Clear to auscultation bilaterally Abdominal:     General: Bowel sounds are normal.     Palpations: Abdomen is soft.     Tenderness: There is no abdominal tenderness. There is no right CVA tenderness, left CVA tenderness, guarding or rebound.     Comments: Benign abdominal exam  Genitourinary:    Rectum: Tenderness and external hemorrhoid present. No mass, anal fissure or internal  hemorrhoid.     Comments: [place],  RN present as chaperone during exam.  Significant tenderness with DRE.  Small external hemorrhoid noted in 2 o'clock position without evidence of thrombosis.  No palpable internal hemorrhoids.  No mass or abscess.  Exam was limited as patient had difficulty tolerating it. Psychiatric:        Behavior: Behavior is cooperative.      UC Treatments / Results  Labs (all labs ordered are listed, but only abnormal results are displayed) Labs Reviewed  CBC WITH DIFFERENTIAL/PLATELET  POC HEMOCCULT BLD/STL (OFFICE/1-CARD/DIAGNOSTIC)    EKG   Radiology No results found.  Procedures Procedures (including critical care time)  Medications Ordered in UC Medications  acetaminophen (TYLENOL) tablet 975 mg (975 mg Oral Given 07/19/23 1136)    Initial Impression / Assessment and Plan / UC Course  I have reviewed the triage vital signs and the nursing notes.  Pertinent labs & imaging results that were available during my care of the patient were reviewed by me and considered in my medical decision making (see chart for details).     Patient is well-appearing, afebrile, nontoxic, nontachycardic.  Unclear etiology of symptoms.  She did have a small external hemorrhoid on exam but I do not believe this is causing the majority of her pain.  Fecal occult test was negative.  Will treat with hydrocortisone suppositories and topical medication.  Discussed the importance of avoiding constipation by using fiber and can use MiraLAX if needed.  CBC was obtained to monitor for hemoglobin and white count.  We will contact her if this is abnormal.  Discussed that if her symptoms are not improving quickly with conservative treatment measures she should follow-up with a specialist.  I did give her the information for a colorectal surgeon in the area but if she has any trouble getting in with them she can also see a general surgeon was given the contact information as well.   Discussed that if anything worsens or changes and she has difficulty passing stool, severe abdominal pain, blood in her stool, weakness, fever, nausea, vomiting she needs to be seen immediately.  Strict return precautions given.  Excuse note provided.  Final Clinical Impressions(s) / UC Diagnoses   Final diagnoses:  Rectal pain  Blood in stool     Discharge Instructions      There was no blood in your stool.  I will contact you if your blood work is abnormal.  Please use the hydrocortisone suppositories twice daily.  Apply Anusol cream as needed.  It is very important that you avoid constipation so please increase the fiber in your diet.  You can use MiraLAX as well.  If your symptoms are not improving very quickly please follow-up with colorectal surgeon.  I gave you the information for a specialist in the area [facility] colorectal surgery) but if you have trouble going to see them you can also see a general surgeon [facility]); call to schedule an appointment.  If anything worsens and you have difficulty passing a bowel movement, increasing pain, ongoing blood you need to be seen immediately.     ED Prescriptions     Medication Sig Dispense Auth. Provider   hydrocortisone (ANUSOL-HC) 25 MG suppository Place 1 suppository (25 mg total) rectally 2 (two) times daily. 12 suppository Zurii Hewes K, PA-C   hydrocortisone (ANUSOL-HC) 2.5 % rectal cream Place 1 Application rectally 2 (two) times daily. 30 g Charlie Char, Noberto Retort, PA-C      PDMP not reviewed this encounter.  Jeani Hawking, PA-C 07/19/23 1151

## 2023-07-19 NOTE — Discharge Instructions (Addendum)
There was no blood in your stool.  I will contact you if your blood work is abnormal.  Please use the hydrocortisone suppositories twice daily.  Apply Anusol cream as needed.  It is very important that you avoid constipation so please increase the fiber in your diet.  You can use MiraLAX as well.  If your symptoms are not improving very quickly please follow-up with colorectal surgeon.  I gave you the information for a specialist in the area [facility] colorectal surgery) but if you have trouble going to see them you can also see a general surgeon [facility]); call to schedule an appointment.  If anything worsens and you have difficulty passing a bowel movement, increasing pain, ongoing blood you need to be seen immediately.

## 2023-07-19 NOTE — ED Triage Notes (Signed)
Pt c/o rectal pain that has been occurring for over a month. She assumes it is hemorrhoids and has been using cream but states it has gotten worse.

## 2023-07-20 ENCOUNTER — Other Ambulatory Visit: Payer: Self-pay | Admitting: Adult Health

## 2023-07-20 MED ORDER — VALACYCLOVIR HCL 1 G PO TABS: ORAL_TABLET | ORAL | 3 refills | Status: DC

## 2023-07-31 DIAGNOSIS — K610 Anal abscess: Secondary | ICD-10-CM | POA: Diagnosis not present

## 2023-08-07 ENCOUNTER — Ambulatory Visit (INDEPENDENT_AMBULATORY_CARE_PROVIDER_SITE_OTHER): Payer: BC Managed Care – PPO | Admitting: Obstetrics & Gynecology

## 2023-08-07 ENCOUNTER — Encounter: Payer: Self-pay | Admitting: Obstetrics & Gynecology

## 2023-08-07 ENCOUNTER — Other Ambulatory Visit (HOSPITAL_COMMUNITY)
Admission: RE | Admit: 2023-08-07 | Discharge: 2023-08-07 | Disposition: A | Discharge status: Home / Self Care | Payer: BC Managed Care – PPO | Source: Ambulatory Visit | Attending: Obstetrics & Gynecology | Admitting: Obstetrics & Gynecology

## 2023-08-07 VITALS — BP 119/78 | HR 74 | Ht 64.0 in | Wt 130.2 lb

## 2023-08-07 DIAGNOSIS — Z3202 Encounter for pregnancy test, result negative: Secondary | ICD-10-CM | POA: Diagnosis not present

## 2023-08-07 DIAGNOSIS — R87610 Atypical squamous cells of undetermined significance on cytologic smear of cervix (ASC-US): Secondary | ICD-10-CM | POA: Insufficient documentation

## 2023-08-07 DIAGNOSIS — R87810 Cervical high risk human papillomavirus (HPV) DNA test positive: Secondary | ICD-10-CM

## 2023-08-07 DIAGNOSIS — D069 Carcinoma in situ of cervix, unspecified: Secondary | ICD-10-CM | POA: Diagnosis not present

## 2023-08-07 LAB — POCT URINE PREGNANCY: Preg Test, Ur: NEGATIVE

## 2023-08-07 NOTE — Progress Notes (Signed)
    Patient name: Holly Joseph MRN [id number]  Date of birth: [birth year] Chief Complaint:   Colposcopy  History of Present Illness:   Holly Joseph is a 37 y.o. [phone] female being seen today for cervical dysplasia management.  Recent pap: ASC-H, HPV 16 positive Prior 02/2022: ASCH, HPV 16 positive  Smoker:  Yes.   New sexual partner:  No.   Contraception: tubal ligation  Denies acute GYN concern.  Denies vaginal discharge, itching or irritation.  Denies pelvic or abdominal pain. Denies postcoital bleeding  Patient's last menstrual period was 07/31/2023 (approximate).     06/03/2023    3:13 PM 03/26/2022   11:21 AM  Depression screen PHQ 2/9  Decreased Interest 0 0  Down, Depressed, Hopeless 0 0  PHQ - 2 Score 0 0  Altered sleeping 1 0  Tired, decreased energy 1 0  Change in appetite 0 0  Feeling bad or failure about yourself  0 0  Trouble concentrating 0 0  Moving slowly or fidgety/restless 0 0  Suicidal thoughts 0 0  PHQ-9 Score 2 0     Review of Systems:   Pertinent items are noted in HPI Denies fever/chills, dizziness, headaches, visual disturbances, fatigue, shortness of breath, chest pain, abdominal pain, vomiting, no problems with periods, bowel movements, urination, or intercourse unless otherwise stated above.  Pertinent History Reviewed:  Reviewed past medical,surgical, social, obstetrical and family history.  Reviewed problem list, medications and allergies. Physical Assessment:   Vitals:   08/07/23 0946  BP: 119/78  Pulse: 74  Weight: 130 lb 3.2 oz (59.1 kg)  Height: 5\' 4"  (1.626 m)  Body mass index is 22.35 kg/m.       Physical Examination:   General appearance: alert, well appearing, and in no distress  Psych: mood appropriate, normal affect  Skin: warm & dry   Cardiovascular: normal heart rate noted  Respiratory: normal respiratory effort, no distress  Pelvic: VULVA: normal appearing vulva with no masses, tenderness or lesions,  VAGINA: normal appearing vagina with normal color and discharge, no lesions, CERVIX: see colposcopy section  Extremities: no edema   Chaperone: Faith Rogue     Colposcopy Procedure Note  Indications: ASC-H, HPV 16 positive    Procedure Details  The risks and benefits of the procedure and Written informed consent obtained.  Speculum placed in vagina and excellent visualization of cervix achieved, cervix swabbed x 3 with acetic acid solution.  Findings: Adequate colposcopy is noted today.  TMZ zone present  Cervix:  @ cervical os- bulbous cervix with ?mass noted anterior portion of os ~ 1cm in size with considerable HPV changes in this area.  ECC and cervical biopsies obtained.    Monsel's applied.  Adequate hemostasis noted  Specimens: ECC and cervical biopsies  Complications: none.  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   Plan(Based on 2019 ASCCP recommendations)  -Discussed HPV- reviewed incidence and its potential to cause condylomas to dysplasia to cervical cancer -Reviewed degree of abnormal pap smears  -Discussed ASCCP guidelines and current recommendations for colposcopy -As above, inform consent obtained and procedure completed -biopsies obtained, further management pending results -Questions and concerns were addressed -Briefly discussed neck step should moderate or severe dysplasia be present, would plan for LEEP  Myna Hidalgo, DO Attending Obstetrician & Gynecologist, [facility] [employer], [facility]

## 2023-08-12 LAB — SURGICAL PATHOLOGY

## 2023-08-14 ENCOUNTER — Ambulatory Visit (INDEPENDENT_AMBULATORY_CARE_PROVIDER_SITE_OTHER): Payer: BC Managed Care – PPO | Admitting: Obstetrics & Gynecology

## 2023-08-14 ENCOUNTER — Encounter: Payer: Self-pay | Admitting: Obstetrics & Gynecology

## 2023-08-14 VITALS — BP 121/78 | HR 62 | Ht 64.0 in | Wt 131.4 lb

## 2023-08-14 DIAGNOSIS — K610 Anal abscess: Secondary | ICD-10-CM | POA: Diagnosis not present

## 2023-08-14 DIAGNOSIS — R87810 Cervical high risk human papillomavirus (HPV) DNA test positive: Secondary | ICD-10-CM | POA: Diagnosis not present

## 2023-08-14 DIAGNOSIS — D069 Carcinoma in situ of cervix, unspecified: Secondary | ICD-10-CM

## 2023-08-14 NOTE — Progress Notes (Signed)
   GYN VISIT Patient name: Holly Joseph MRN [id number]  Date of birth: 08-Jul-1986 Chief Complaint:   discuss colpo results  History of Present Illness:   HARLEM KEUNE is a 37 y.o. [phone] female being seen today for cervical dysplasia.   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  Contraception: BTL  Patient reports that her mother had cervical cancer  Patient's last menstrual period was 07/31/2023 (approximate).    Review of Systems:   Pertinent items are noted in HPI Denies fever/chills, dizziness, headaches, visual disturbances, fatigue, shortness of breath, chest pain, abdominal pain, vomiting, no problems with periods, bowel movements, urination, or intercourse unless otherwise stated above.  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%   BV (bacterial vaginosis)    Eclamptic seizure    Pancreatitis    Screening for STD (sexually transmitted disease) 12/05/2013   Reviewed problem list, medications and allergies. Physical Assessment:   Vitals:   08/14/23 0904  BP: 121/78  Pulse: 62  Weight: 131 lb 6.4 oz (59.6 kg)  Height: 5\' 4"  (1.626 m)  Body mass index is 22.55 kg/m.       Physical Examination:   General appearance: alert, well appearing, and in no distress  Psych: mood appropriate, normal affect  Skin: warm & dry   Cardiovascular: normal heart rate noted  Respiratory: normal respiratory  effort, no distress  Abdomen: soft, non-tender   Pelvic: examination not indicated  Extremities: no edema   Chaperone: N/A    Assessment & Plan:  1) CIN 3 -Reviewed recent pathology report.  Discussed that only 1 biopsy showed CIN-3 and others were negative though concern for scant sampling -Discussed ASCCP recommendations with proceeding towards excisional procedure -Discussed risk benefit including risk of bleeding, infection and injury such as cervical shortening.  Reviewed hospital expectations and recovery including pelvic rest x 4 weeks -Discussed alternative which would be conservative management and consideration for colposcopy in 3 to 6 months -After reviewing risk benefit of each option patient desires to proceed with LEEP -Surgical referral placed   Orders Placed This Encounter  Procedures   Ambulatory Referral For Surgery Scheduling    Return for TBD.   Myna Hidalgo, DO Attending Obstetrician & Gynecologist, [facility] for [employer], [facility]

## 2023-08-16 NOTE — Progress Notes (Unsigned)
   New Patient Office Visit   Subjective   Patient ID: Holly Joseph, female    DOB: 09/20/1986  Age: 37 y.o. MRN: [id number]  CC: No chief complaint on file.   HPI Holly Joseph 37 year old female, presents to establish care. She  has a past medical history of Abnormal Pap smear of cervix (04/02/2022), BV (bacterial vaginosis), Eclamptic seizure, Pancreatitis, and Screening for STD (sexually transmitted disease) (12/05/2013).  HPI    Outpatient Encounter Medications as of 08/17/2023  Medication Sig   valACYclovir (VALTREX) 1000 MG tablet Take 1 daily po for suppression   No facility-administered encounter medications on file as of 08/17/2023.    Past Surgical History:  Procedure Laterality Date   NO PAST SURGERIES     TUBAL LIGATION  06/28/2012   Procedure: POST PARTUM TUBAL LIGATION;  Surgeon: Tereso Newcomer, MD;  Location: [facility];  Service: Gynecology;  Laterality: Bilateral;  Post partum tubal ligation with filshie clips    ROS    Objective    LMP 07/31/2023 (Approximate)   Physical Exam    Assessment & Plan:  There are no diagnoses linked to this encounter.  No follow-ups on file.   Cruzita Lederer Newman Nip, FNP

## 2023-08-16 NOTE — Patient Instructions (Signed)

## 2023-08-17 ENCOUNTER — Ambulatory Visit (INDEPENDENT_AMBULATORY_CARE_PROVIDER_SITE_OTHER): Payer: BC Managed Care – PPO | Admitting: Family Medicine

## 2023-08-17 ENCOUNTER — Encounter: Payer: Self-pay | Admitting: Family Medicine

## 2023-08-17 VITALS — BP 116/74 | HR 76 | Ht 64.0 in | Wt 134.0 lb

## 2023-08-17 DIAGNOSIS — D509 Iron deficiency anemia, unspecified: Secondary | ICD-10-CM | POA: Diagnosis not present

## 2023-08-17 DIAGNOSIS — K649 Unspecified hemorrhoids: Secondary | ICD-10-CM

## 2023-08-17 DIAGNOSIS — E559 Vitamin D deficiency, unspecified: Secondary | ICD-10-CM

## 2023-08-17 DIAGNOSIS — R7301 Impaired fasting glucose: Secondary | ICD-10-CM

## 2023-08-17 DIAGNOSIS — Z136 Encounter for screening for cardiovascular disorders: Secondary | ICD-10-CM

## 2023-08-17 DIAGNOSIS — E038 Other specified hypothyroidism: Secondary | ICD-10-CM

## 2023-08-17 MED ORDER — HYDROCORTISONE ACETATE 25 MG RE SUPP: 25.0000 mg | Freq: Two times a day (BID) | RECTAL | 2 refills | Status: DC

## 2023-08-17 NOTE — Assessment & Plan Note (Signed)
Patient underwent an incision and drainage (I&D) of a perianal abscess on 11/1, which provided moderate relief, and reports feeling better. Sent in hydrocortisone 25 mg suppositories was provided for additional symptom management.  Advise hemorrhoid self-care, keep the area clean and dry, and consider warm sitz baths to soothe irritation. Increase fiber and water intake to prevent constipation, and avoid prolonged sitting or straining during bowel movements. Advise Miralax (1 cap daily) as recommended for constipation to prevent staining. Drink 8-10 glasses of water daily to keep stools soft and promote regularity.

## 2023-08-18 LAB — IRON,TIBC AND FERRITIN PANEL
Ferritin: 58 ng/mL (ref 15–150)
Iron Saturation: 22 % (ref 15–55)
Iron: 67 ug/dL (ref 27–159)
Total Iron Binding Capacity: 310 ug/dL (ref 250–450)
UIBC: 243 ug/dL (ref 131–425)

## 2023-08-18 LAB — VITAMIN D 25 HYDROXY (VIT D DEFICIENCY, FRACTURES): Vit D, 25-Hydroxy: 17.3 ng/mL — ABNORMAL LOW (ref 30.0–100.0)

## 2023-08-18 LAB — HEMOGLOBIN A1C
Est. average glucose Bld gHb Est-mCnc: 126 mg/dL
Hgb A1c MFr Bld: 6 % — ABNORMAL HIGH (ref 4.8–5.6)

## 2023-08-18 LAB — VITAMIN B12: Vitamin B-12: 811 pg/mL (ref 232–1245)

## 2023-08-24 NOTE — Progress Notes (Unsigned)
GI Office Note    Referring Provider: Wylene Men* Primary Care Physician:  Rica Records, FNP  Primary Gastroenterologist:  Chief Complaint   No chief complaint on file.    History of Present Illness   Holly Joseph is a 37 y.o. female presenting today at the request of Rica Records, FNP for recurrent hemorrhoids, status post I&D of perianal abscess November 1, possibility of anal fistula.   Seen in 2021 during admission for acute pancreatitis in the setting of heavy alcohol use.  AST/ALT pattern consistent with alcoholic hepatitis at that time.  Also noted to have liver lesions measuring4.9 x 4.4 cm and 2.1 x 2.5 cm not characteristic of hemangiomas, may reflect hepatic adenomas, less likely FNH.  Recommend MRI abdomen with and without contrast in 3 months with [facility] (scheduling request has been sent to our office).  This was never completed.     Medications   Current Outpatient Medications  Medication Sig Dispense Refill   hydrocortisone (ANUSOL-HC) 25 MG suppository Place 1 suppository (25 mg total) rectally 2 (two) times daily. 12 suppository 2   valACYclovir (VALTREX) 1000 MG tablet Take 1 daily po for suppression 90 tablet 3   No current facility-administered medications for this visit.    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%   BV (bacterial vaginosis)    Eclamptic seizure    Pancreatitis    Screening for STD (sexually transmitted disease) 12/05/2013    Past Surgical History   Past Surgical History:  Procedure Laterality Date   NO PAST SURGERIES     TUBAL LIGATION  06/28/2012   Procedure: POST PARTUM TUBAL LIGATION;  Surgeon:  Tereso Newcomer, MD;  Location: [facility];  Service: Gynecology;  Laterality: Bilateral;  Post partum tubal ligation with filshie clips    Past Family History   Family History  Problem Relation Age of Onset   Stroke Mother    Diabetes Mother    Hypertension Mother    Cancer Mother        cervical    Diabetes Sister    Cancer Maternal Aunt    Pancreatitis Cousin    Other Neg Hx    Colon cancer Neg Hx     Past Social History   Social History   Socioeconomic History   Marital status: Married    Spouse name: Not on file   Number of children: Not on file   Years of education: Not on file   Highest education level: Some college, no degree  Occupational History   Not on file  Tobacco Use   Smoking status: Every Day    Current packs/day: 0.50    Average packs/day: 0.5 packs/day for 18.0 years (9.0 ttl pk-yrs)    Types: Cigarettes   Smokeless tobacco: Never  Vaping Use   Vaping status: Never Used  Substance and Sexual Activity   Alcohol use: Not Currently    Comment: monthly   Drug use: Yes    Types: Marijuana    Comment: weekly   Sexual activity: Not Currently    Birth control/protection: Surgical    Comment: tubal  Other  Topics Concern   Not on file  Social History Narrative   ** Merged History Encounter **       Social Determinants of Health   Financial Resource Strain: Medium Risk (08/13/2023)   Overall Financial Resource Strain (CARDIA)    Difficulty of Paying Living Expenses: Somewhat hard  Food Insecurity: Food Insecurity Present (08/17/2023)   Hunger Vital Sign    Worried About Running Out of Food in the Last Year: Sometimes true    Ran Out of Food in the Last Year: Sometimes true  Transportation Needs: No Transportation Needs (08/13/2023)   PRAPARE - Administrator, Civil Service (Medical): No    Lack of Transportation (Non-Medical): No  Physical Activity: Insufficiently Active (08/13/2023)   Exercise Vital Sign    Days of Exercise per  Week: 3 days    Minutes of Exercise per Session: 40 min  Stress: Stress Concern Present (08/13/2023)   [employer] of Occupational Health - Occupational Stress Questionnaire    Feeling of Stress : To some extent  Social Connections: Moderately Integrated (08/13/2023)   Social Connection and Isolation Panel [NHANES]    Frequency of Communication with Friends and Family: Twice a week    Frequency of Social Gatherings with Friends and Family: Twice a week    Attends Religious Services: 1 to 4 times per year    Active Member of [employer] or Organizations: Patient declined    Attends Banker Meetings: 1 to 4 times per year    Marital Status: Divorced  Catering manager Violence: Not At Risk (06/03/2023)   Humiliation, Afraid, Rape, and Kick questionnaire    Fear of Current or Ex-Partner: No    Emotionally Abused: No    Physically Abused: No    Sexually Abused: No    Review of Systems   General: Negative for anorexia, weight loss, fever, chills, fatigue, weakness. Eyes: Negative for vision changes.  ENT: Negative for hoarseness, difficulty swallowing , nasal congestion. CV: Negative for chest pain, angina, palpitations, dyspnea on exertion, peripheral edema.  Respiratory: Negative for dyspnea at rest, dyspnea on exertion, cough, sputum, wheezing.  GI: See history of present illness. GU:  Negative for dysuria, hematuria, urinary incontinence, urinary frequency, nocturnal urination.  MS: Negative for joint pain, low back pain.  Derm: Negative for rash or itching.  Neuro: Negative for weakness, abnormal sensation, seizure, frequent headaches, memory loss,  confusion.  Psych: Negative for anxiety, depression, suicidal ideation, hallucinations.  Endo: Negative for unusual weight change.  Heme: Negative for bruising or bleeding. Allergy: Negative for rash or hives.  Physical Exam   LMP 07/31/2023 (Approximate)    General: Well-nourished, well-developed in no acute distress.   Head: Normocephalic, atraumatic.   Eyes: Conjunctiva pink, no icterus. Mouth: Oropharyngeal mucosa moist and pink , no lesions erythema or exudate. Neck: Supple without thyromegaly, masses, or lymphadenopathy.  Lungs: Clear to auscultation bilaterally.  Heart: Regular rate and rhythm, no murmurs rubs or gallops.  Abdomen: Bowel sounds are normal, nontender, nondistended, no hepatosplenomegaly or masses,  no abdominal bruits or hernia, no rebound or guarding.   Rectal: *** Extremities: No lower extremity edema. No clubbing or deformities.  Neuro: Alert and oriented x 4 , grossly normal neurologically.  Skin: Warm and dry, no rash or jaundice.   Psych: Alert and cooperative, normal mood and affect.  Labs   Lab Results  Component Value Date   IRON 67 08/17/2023   TIBC 310 08/17/2023   FERRITIN 58 08/17/2023  Lab Results  Component Value Date   VITAMINB12 811 08/17/2023   Lab Results  Component Value Date   HGBA1C 6.0 (H) 08/17/2023   Lab Results  Component Value Date   WBC 5.4 07/19/2023   HGB 13.9 07/19/2023   HCT 42.3 07/19/2023   MCV 90.4 07/19/2023   PLT 239 07/19/2023   Lab Results  Component Value Date   TSH 0.598 06/03/2023   Lab Results  Component Value Date   ALT 16 06/03/2023   AST 19 06/03/2023   ALKPHOS 61 06/03/2023   BILITOT 0.6 06/03/2023   Lab Results  Component Value Date   NA 140 06/03/2023   CL 103 06/03/2023   K 3.9 06/03/2023   CO2 17 (L) 06/03/2023   BUN 12 06/03/2023   CREATININE 0.82 06/03/2023   EGFR 96 06/03/2023   CALCIUM 9.5 06/03/2023   PHOS 1.2 (L) 12/28/2019   ALBUMIN 4.3 06/03/2023   GLUCOSE 89 06/03/2023    Imaging Studies   No results found.  Assessment       PLAN   ***   Leanna Battles. Melvyn Neth, MHS, PA-C [facility]

## 2023-08-25 ENCOUNTER — Telehealth: Payer: Self-pay | Admitting: *Deleted

## 2023-08-25 ENCOUNTER — Telehealth: Payer: Self-pay | Admitting: Gastroenterology

## 2023-08-25 ENCOUNTER — Ambulatory Visit (INDEPENDENT_AMBULATORY_CARE_PROVIDER_SITE_OTHER): Payer: BC Managed Care – PPO | Admitting: Gastroenterology

## 2023-08-25 ENCOUNTER — Encounter: Payer: Self-pay | Admitting: *Deleted

## 2023-08-25 ENCOUNTER — Encounter: Payer: Self-pay | Admitting: Gastroenterology

## 2023-08-25 VITALS — BP 112/73 | HR 80 | Temp 98.6°F | Ht 64.0 in | Wt 136.2 lb

## 2023-08-25 DIAGNOSIS — R932 Abnormal findings on diagnostic imaging of liver and biliary tract: Secondary | ICD-10-CM | POA: Diagnosis not present

## 2023-08-25 DIAGNOSIS — K610 Anal abscess: Secondary | ICD-10-CM | POA: Insufficient documentation

## 2023-08-25 DIAGNOSIS — K769 Liver disease, unspecified: Secondary | ICD-10-CM | POA: Diagnosis not present

## 2023-08-25 NOTE — Telephone Encounter (Signed)
Per [employer] " Pre-Authorization is not Required"

## 2023-08-25 NOTE — Telephone Encounter (Signed)
Please let pt know that Dr. Jena Gauss also wants to wait until after general surgery is done evaluating patient for perianal abscess before pursuing a colonoscopy. Let her know to contact us when she has been released by them.

## 2023-08-25 NOTE — Patient Instructions (Signed)
Continue to follow with general surgery for perianal abscess. Once you have been cleared with them, we need to complete a colonoscopy. I will also discuss timing with Dr. Jena Gauss here.  Avoid constipation and straining. Continue Fiber gummies. If you need more help, miralax 1-2 capfuls daily can be very helpful in softening the stool.  You can use over the counter hemorrhoid cream that has lidocaine in it to help with rectal pain. Use per package instructions.   MRI liver to be scheduled.

## 2023-08-26 NOTE — Telephone Encounter (Signed)
Lmom for pt to return call. 

## 2023-08-26 NOTE — Telephone Encounter (Signed)
Pt was made aware and verbalized understanding.

## 2023-09-02 ENCOUNTER — Ambulatory Visit (HOSPITAL_COMMUNITY)
Admission: RE | Admit: 2023-09-02 | Discharge: 2023-09-02 | Disposition: A | Discharge status: Home / Self Care | Payer: BC Managed Care – PPO | Source: Ambulatory Visit | Attending: Gastroenterology | Admitting: Gastroenterology

## 2023-09-02 DIAGNOSIS — R932 Abnormal findings on diagnostic imaging of liver and biliary tract: Secondary | ICD-10-CM | POA: Insufficient documentation

## 2023-09-02 MED ORDER — GADOXETATE DISODIUM 0.25 MMOL/ML IV SOLN
6.0000 mL | Freq: Once | INTRAVENOUS | Status: AC | PRN
Start: 2023-09-02 — End: 2023-09-02
  Administered 2023-09-02: 6 mL via INTRAVENOUS

## 2023-09-04 NOTE — Patient Instructions (Signed)
Holly Joseph  09/04/2023     @PREFPERIOPPHARMACY @   Your procedure is scheduled on 09/09/2023.   Report to [facility] at 9:00 A.M.   Call this number if you have problems the morning of surgery:  [phone]  If you experience any cold or flu symptoms such as cough, fever, chills, shortness of breath, etc. between now and your scheduled surgery, please notify us at the above number.   Remember:   Do not eat anything after midnight.   You may drink clear liquids until 7:00am .  Clear liquids allowed are:                    Water, Juice (No red color; non-citric and without pulp; diabetics please choose diet or no sugar options), Carbonated beverages (diabetics please choose diet or no sugar options), Clear Tea (No creamer, milk, or cream, including half & half and powdered creamer), and Black Coffee Only (No creamer, milk or cream, including half & half and powdered creamer)    Take these medicines the morning of surgery with A SIP OF WATER : none    Do not wear jewelry, make-up or nail polish, including gel polish,  artificial nails, or any other type of covering on natural nails (fingers and  toes).  Do not wear lotions, powders, or perfumes, or deodorant.  Do not shave 48 hours prior to surgery.  Men may shave face and neck.  Do not bring valuables to the hospital.  [facility] is not responsible for any belongings or valuables.  Contacts, dentures or bridgework may not be worn into surgery.  Leave your suitcase in the car.  After surgery it may be brought to your room.  For patients admitted to the hospital, discharge time will be determined by your treatment team.  Patients discharged the day of surgery will not be allowed to drive home.   Name and phone number of your driver:   family Special instructions:  N/A  Please read over the following fact sheets that you were given.  Care and Recovery After Surgery   Loop Electrosurgical Excision Procedure Loop  electrosurgical excision procedure (LEEP) is the cutting and removal (excision) of tissue from the cervix. The cervix is the bottom part of the uterus that opens into the vagina. The tissue that is removed from the cervix is examined to see if there are cancer cells or cells that might turn into cancer (precancerous cells). LEEP may be done when: You have abnormal bleeding from your cervix. You have an abnormal Pap test result. Your health care provider finds abnormalities on your cervix during an exam. LEEP typically only takes a few minutes and is often done in the health care provider's office. The procedure is safe for women who are trying to get pregnant. The procedure is usually not done during a menstrual period or during pregnancy. Tell a health care provider about: Any allergies you have. All medicines you are taking, including vitamins, herbs, eye drops, creams, and over-the-counter medicines. Any problems you or family members have had with anesthetic medicines. Any bleeding problems you have. Any medical conditions you have or have had. This includes current or past vaginal infections, such as herpes or STIs (sexually transmitted infections). Whether you are pregnant or may be pregnant. If you are having vaginal bleeding on the day of the procedure. What are the risks? Generally, this is a safe procedure. However, problems may occur, including: Infection. Bleeding. Allergic reactions to  medicines. Changes or scarring in the cervix. Damage to nearby structures or organs. Increased risk of early (preterm) labor in future pregnancies. What happens before the procedure? Ask your health care provider about: Changing or stopping your regular medicines. This is especially important if you are taking diabetes medicines or blood thinners. Taking medicines such as aspirin and ibuprofen. These medicines can thin your blood. Do not take these medicines unless your health care provider tells  you to take them. Taking over-the-counter medicines, vitamins, herbs, and supplements. Your health care provider may recommend that you take pain medicine before the procedure. Ask your health care provider if you should plan to have a responsible adult take you home after the procedure. What happens during the procedure?  An instrument called a speculum will be placed in your vagina. This will allow your health care provider to see your cervix. You will be given a medicine to numb the area (local anesthetic). The medicine will be injected into your cervix and the surrounding area. A solution will be applied to your cervix. This solution will help the health care provider find the abnormal cells that need to be removed. A thin wire loop will be passed through your vagina to your cervix. The wire will remove layers of abnormal cervical cells. The wire will burn (cauterize) the cervical tissue with an electrical current during cell removal. Open blood vessels will be cauterized to prevent bleeding. You might feel some pressure, aching, and cramping. If you feel like you will faint during the procedure, tell your health care provider right away. A paste may be applied to the cauterized area of your cervix to help control bleeding. The sample of cervical tissue will be sent to a lab and looked at under a microscope. The procedure may vary among health care providers and hospitals. What can I expect after the procedure? After the procedure, it is common to have: Mild abdominal cramps that may last for up to 1 week. A small amount of pink-tinged or bloody vaginal discharge, including light to moderate bleeding, for 1-2 weeks. A brown- or black-colored discharge coming from your vagina, if a paste was used on the cervix to control bleeding. It is up to you to get the results of your procedure. Ask your health care provider, or the department that is doing the procedure, when your results will be  ready. Follow these instructions at home: Take over-the-counter and prescription medicines only as told by your health care provider. Return to your normal activities as told by your health care provider. Ask your health care provider what activities are safe for you. Do not put anything in your vagina for 2 weeks after the procedure or until your health care provider says that it is okay. This includes tampons, creams, and douches. Do not have sex until your health care provider approves. Keep all follow-up visits. This is important. Contact a health care provider if: You have a fever or chills. You feel very weak. You have blood clots or bleeding that is heavier than a normal menstrual period. Bleeding that soaks a pad in less than 1 hour is considered heavy bleeding. You develop a bad-smelling discharge from your vagina. You have severe abdominal pain or cramping. Summary Loop electrosurgical excision procedure (LEEP) is the removal of tissue from the cervix. The removed tissue will be checked for precancerous cells or cancer cells. LEEP typically only takes a few minutes and is often done in your health care provider's office. Do not  put anything in your vagina for 2 weeks after the procedure or until your health care provider says that it is okay. This includes tampons, creams, and douches. Ask your health care provider, or the department that is doing the procedure, when your results will be ready. This information is not intended to replace advice given to you by your health care provider. Make sure you discuss any questions you have with your health care provider. Document Revised: 02/20/2021 Document Reviewed: 02/20/2021 Elsevier Patient Education  2024 [employer] General Anesthesia, Adult General anesthesia is the use of medicine to make you fall asleep (unconscious) for a medical procedure. General anesthesia must be used for certain procedures. It is often recommended for  surgery or procedures that: Last a long time. Require you to be still or in an unusual position. Are major and can cause blood loss. Affect your breathing. The medicines used for general anesthesia are called general anesthetics. During general anesthesia, these medicines are given along with medicines that: Prevent pain. Control your blood pressure. Relax your muscles. Prevent nausea and vomiting after the procedure. Tell a health care provider about: Any allergies you have. All medicines you are taking, including vitamins, herbs, eye drops, creams, and over-the-counter medicines. Your history of any: Medical conditions you have, including: High blood pressure. Bleeding problems. Diabetes. Heart or lung conditions, such as: Heart failure. Sleep apnea. Asthma. Chronic obstructive pulmonary disease (COPD). Current or recent illnesses, such as: Upper respiratory, chest, or ear infections. Cough or fever. Tobacco or drug use, including marijuana or alcohol use. Depression or anxiety. Surgeries and types of anesthetics you have had. Problems you or family members have had with anesthetic medicines. Whether you are pregnant or may be pregnant. Whether you have any chipped or loose teeth, dentures, caps, bridgework, or issues with your mouth, swallowing, or choking. What are the risks? Your health care provider will talk with you about risks. These may include: Allergic reaction to the medicines. Lung and heart problems. Inhaling food or liquid from the stomach into the lungs (aspiration). Nerve injury. Injury to the lips, mouth, teeth, or gums. Stroke. Waking up during your procedure and being unable to move. This is rare. These problems are more likely to develop if you are having a major surgery or if you have an advanced or serious medical condition. You can prevent some of these complications by answering all of your health care provider's questions thoroughly and by following  all instructions before your procedure. General anesthesia can cause side effects, including: Nausea or vomiting. A sore throat or hoarseness from the breathing tube. Wheezing or coughing. Shaking chills or feeling cold. Body aches. Sleepiness. Confusion, agitation (delirium), or anxiety. What happens before the procedure? When to stop eating and drinking Follow instructions from your health care provider about what you may eat and drink before your procedure. If you do not follow your health care provider's instructions, your procedure may be delayed or canceled. Medicines Ask your health care provider about: Changing or stopping your regular medicines. These include any diabetes medicines or blood thinners you take. Taking medicines such as aspirin and ibuprofen. These medicines can thin your blood. Do not take them unless your health care provider tells you to. Taking over-the-counter medicines, vitamins, herbs, and supplements. General instructions Do not use any products that contain nicotine or tobacco for at least 4 weeks before the procedure. These products include cigarettes, chewing tobacco, and vaping devices, such as e-cigarettes. If you need help quitting, ask your  health care provider. If you brush your teeth on the morning of the procedure, make sure to spit out all of the water and toothpaste. If told by your health care provider, bring your sleep apnea device with you to surgery (if applicable). If you will be going home right after the procedure, plan to have a responsible adult: Take you home from the hospital or clinic. You will not be allowed to drive. Care for you for the time you are told. What happens during the procedure?  An IV will be inserted into one of your veins. You will be given one or more of the following through a face mask or IV: A sedative. This helps you relax. Anesthesia. This will: Numb certain areas of your body. Make you fall asleep for  surgery. After you are unconscious, a breathing tube may be inserted down your throat to help you breathe. This will be removed before you wake up. An anesthesia provider, such as an anesthesiologist, will stay with you throughout your procedure. The anesthesia provider will: Keep you comfortable and safe by continuing to give you medicines and adjusting the amount of medicine that you get. Monitor your blood pressure, heart rate, and oxygen levels to make sure that the anesthetics do not cause any problems. The procedure may vary among health care providers and hospitals. What happens after the procedure? Your blood pressure, temperature, heart rate, breathing rate, and blood oxygen level will be monitored until you leave the hospital or clinic. You will wake up in a recovery area. You may wake up slowly. You may be given medicine to help you with pain, nausea, or any other side effects from the anesthesia. Summary General anesthesia is the use of medicine to make you fall asleep (unconscious) for a medical procedure. Follow your health care provider's instructions about when to stop eating, drinking, or taking certain medicines before your procedure. Plan to have a responsible adult take you home from the hospital or clinic. This information is not intended to replace advice given to you by your health care provider. Make sure you discuss any questions you have with your health care provider. Document Revised: 12/12/2021 Document Reviewed: 12/12/2021 Elsevier Patient Education  2024 [employer].

## 2023-09-07 ENCOUNTER — Encounter (HOSPITAL_COMMUNITY)
Admission: RE | Admit: 2023-09-07 | Discharge: 2023-09-07 | Disposition: A | Discharge status: Home / Self Care | Payer: BC Managed Care – PPO | Source: Ambulatory Visit | Attending: Obstetrics & Gynecology | Admitting: Obstetrics & Gynecology

## 2023-09-07 ENCOUNTER — Encounter (HOSPITAL_COMMUNITY): Payer: Self-pay

## 2023-09-07 VITALS — BP 112/73 | HR 80 | Temp 97.8°F | Resp 16 | Ht 64.0 in | Wt 136.2 lb

## 2023-09-07 DIAGNOSIS — R569 Unspecified convulsions: Secondary | ICD-10-CM | POA: Diagnosis not present

## 2023-09-07 DIAGNOSIS — F17210 Nicotine dependence, cigarettes, uncomplicated: Secondary | ICD-10-CM | POA: Diagnosis not present

## 2023-09-07 DIAGNOSIS — N871 Moderate cervical dysplasia: Secondary | ICD-10-CM | POA: Diagnosis not present

## 2023-09-07 DIAGNOSIS — Z79624 Long term (current) use of inhibitors of nucleotide synthesis: Secondary | ICD-10-CM | POA: Diagnosis not present

## 2023-09-07 DIAGNOSIS — K862 Cyst of pancreas: Secondary | ICD-10-CM | POA: Diagnosis not present

## 2023-09-07 DIAGNOSIS — Z01812 Encounter for preprocedural laboratory examination: Secondary | ICD-10-CM | POA: Insufficient documentation

## 2023-09-07 DIAGNOSIS — Z803 Family history of malignant neoplasm of breast: Secondary | ICD-10-CM | POA: Diagnosis not present

## 2023-09-07 DIAGNOSIS — D069 Carcinoma in situ of cervix, unspecified: Secondary | ICD-10-CM

## 2023-09-07 DIAGNOSIS — Z01818 Encounter for other preprocedural examination: Secondary | ICD-10-CM

## 2023-09-07 DIAGNOSIS — Z8049 Family history of malignant neoplasm of other genital organs: Secondary | ICD-10-CM | POA: Diagnosis not present

## 2023-09-07 DIAGNOSIS — I10 Essential (primary) hypertension: Secondary | ICD-10-CM | POA: Diagnosis not present

## 2023-09-07 LAB — PREGNANCY, URINE: Preg Test, Ur: NEGATIVE

## 2023-09-07 NOTE — H&P (Signed)
[facility]ne[facility] Attending History and Physical  Holly Joseph is a 37 y.o. [id number] who presents for scheduled LEEP due to high grade cervical dysplasia.  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    Contraception: BTL   Denies any abnormal vaginal discharge, fevers, chills, sweats, dysuria, nausea, vomiting, other GI or GU symptoms or other general symptoms.  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%   BV (bacterial vaginosis)    Eclamptic seizure    gestational   Pancreatitis    Screening for STD (sexually transmitted disease) 12/05/2013   Past Surgical History:  Procedure Laterality Date   TUBAL LIGATION  06/28/2012   Procedure: POST PARTUM TUBAL LIGATION;  Surgeon: Tereso Newcomer, MD;  Location: [facility];  Service: Gynecology;  Laterality: Bilateral;  Post partum tubal ligation with filshie clips   OB History  Gravida Para Term Preterm AB Living  2 2 1 1  0 2  SAB IAB Ectopic Multiple Live Births  0 0 0   1    # Outcome Date GA Lbr Len/2nd Weight Sex Type Anes PTL Lv  2 Term 06/28/12 [redacted]w[redacted]d 06:57 / 00:16 3320 g F Vag-Spont None  LIV  1 Preterm           Patient denies any other pertinent gynecologic issues.  No current facility-administered medications on file prior to encounter.   Current Outpatient Medications on File Prior to Encounter  Medication Sig Dispense Refill   valACYclovir (VALTREX) 1000 MG tablet Take 1 daily po for suppression 90 tablet 3   Allergies  Allergen Reactions   Latex Swelling    Social History:   reports that she has been smoking  cigarettes. She has a 9 pack-year smoking history. She has never used smokeless tobacco. She reports that she does not currently use alcohol. She reports current drug use. Drug: Marijuana. Family History  Problem Relation Age of Onset   Stroke Mother    Diabetes Mother    Hypertension Mother    Cancer Mother        cervical    Lung cancer Mother        metastatic   Diabetes Sister    Breast cancer Maternal Aunt    Pancreatitis Cousin    Other Neg Hx    Colon cancer Neg Hx    Inflammatory bowel disease Neg Hx     Review of Systems: Pertinent items noted in HPI and remainder of comprehensive ROS otherwise negative.  PHYSICAL EXAM: Blood pressure 127/76, pulse 68, temperature 98.3 F (36.8 C), temperature source Oral, resp. rate (!) 23, height 5\' 4"  (1.626 m), weight 61.7 kg, last menstrual period 09/02/2023, SpO2 100%. CONSTITUTIONAL: Well-developed, well-nourished female in no acute distress.  SKIN: Skin is warm and dry. No rash noted. Not diaphoretic. No erythema. No pallor. NEUROLOGIC: Alert and oriented to person, place, and time. Normal reflexes, muscle tone coordination. No cranial nerve deficit noted. PSYCHIATRIC: Normal mood and affect. Normal behavior. Normal judgment and thought content. CARDIOVASCULAR: Normal heart rate noted, regular rhythm RESPIRATORY: Effort and breath  sounds normal, no problems with respiration noted ABDOMEN: Soft, nontender, nondistended. PELVIC: deferred MUSCULOSKELETAL: no calf tenderness bilaterally EXT: no edema bilaterally, normal pulses  Labs: Results for orders placed or performed during the hospital encounter of 09/07/23 (from the past 336 hour(s))  Pregnancy, urine   Collection Time: 09/07/23  3:11 PM  Result Value Ref Range   Preg Test, Ur NEGATIVE NEGATIVE    Imaging Studies: MR LIVER W WO CONTRAST  Result Date: 09/02/2023 CLINICAL DATA:  Follow-up liver lesions identified by remote prior CT EXAM: MRI ABDOMEN WITHOUT AND WITH  CONTRAST TECHNIQUE: Multiplanar multisequence MR imaging of the abdomen was performed both before and after the administration of intravenous contrast. CONTRAST:  Eovist gadolinium contrast administered, quantity not reported. Please see technologist documentation for complete contrast administration information. COMPARISON:  CT abdomen pelvis, 12/28/2019 FINDINGS: Lower chest: No acute abnormality. Hepatobiliary: Hepatomegaly, maximum coronal span 20.6 cm. Previously seen sizable hyperenhancing liver lesions in the anterior left lobe of the liver, superior left lobe of the liver, and anterior right lobe of the liver are almost completely resolved and virtually indistinguishable on today's examination, as general matter appreciable only with the benefit of retrospective review of prior examination. There are faint residua of these lesions which are hypoenhancing on delayed hepatobiliary phase, in the anterior left lobe of the liver measuring 0.9 x 0.8 cm (series 27, image 37), and an irregular residua in the anterior right lobe of the liver measuring no greater than 0.7 cm (series 27, image 42). No new liver lesions. No gallstones, gallbladder wall thickening, or biliary dilatation. Pancreas: Fluid signal cystic lesion within the superior pancreatic body measuring 1.9 x 1.3 cm (series 22, image 16). Severe pancreatic ductal dilatation and cystic change within the pancreatic body and tail distal to this lesion, pancreatic duct measuring up to 0.9 cm in caliber (series 2, image 10, series 22, image 18). Spleen: Normal in size without significant abnormality. Adrenals/Urinary Tract: Adrenal glands are unremarkable. Kidneys are normal, without renal calculi, solid lesion, or hydronephrosis. Stomach/Bowel: Stomach is within normal limits. No evidence of bowel wall thickening, distention, or inflammatory changes. Vascular/Lymphatic: No significant vascular findings are present. No enlarged abdominal lymph nodes. Other: No  abdominal wall hernia or abnormality. No ascites. Musculoskeletal: No acute or significant osseous findings. IMPRESSION: 1. Previously seen sizable hyperenhancing liver lesions in the anterior left lobe of the liver, superior left lobe of the liver, and anterior right lobe of the liver are almost completely resolved and virtually indistinguishable on today's examination, as general matter appreciable only with the benefit of retrospective review of prior examination. There are faint residua of these lesions which are hypoenhancing on delayed hepatobiliary phase, in the anterior left lobe of the liver measuring 0.9 x 0.8 cm, and an irregular residua in the anterior right lobe of the liver measuring no greater than 0.7 cm. 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 2. No new liver lesions. 3. Fluid signal cystic lesion within the superior pancreatic body measuring 1.9 x 1.3 cm. Severe pancreatic ductal dilatation and cystic change within the pancreatic body and tail distal to this lesion, pancreatic duct measuring up to 0.9 cm in caliber. Findings are most consistent with chronic sequelae of pancreatitis seen acutely at the time of prior CT, however given patient age, size, complexity, and associated pancreatic ductal dilatation, consider EUS/FNA for further assessment. No acute inflammatory findings at this time. 4. Hepatomegaly. Electronically Signed  By: Jearld Lesch M.D.   On: 09/02/2023 19:44    Assessment: High grade cervical dysplasia   Plan: LEEP -NPO -LR @ 125cc/hr -SCDs to OR -Risk/benefits and alternatives reviewed with the patient including but not limited to risk of bleeding, infection and injury such as cervical shortening.  Questions and concerns were addressed and pt desires to proceed  Myna Hidalgo, DO Attending Obstetrician & Gynecologist, [facility] for [facility],  [facility]

## 2023-09-09 ENCOUNTER — Encounter (HOSPITAL_COMMUNITY): Payer: Self-pay | Admitting: Obstetrics & Gynecology

## 2023-09-09 ENCOUNTER — Ambulatory Visit (HOSPITAL_COMMUNITY): Payer: Self-pay | Admitting: Certified Registered"

## 2023-09-09 ENCOUNTER — Encounter (HOSPITAL_COMMUNITY)
Admission: RE | Disposition: A | Discharge status: Home / Self Care | Payer: Self-pay | Source: Home / Self Care | Attending: Obstetrics & Gynecology

## 2023-09-09 ENCOUNTER — Other Ambulatory Visit: Payer: Self-pay

## 2023-09-09 ENCOUNTER — Ambulatory Visit (HOSPITAL_COMMUNITY)
Admission: RE | Admit: 2023-09-09 | Discharge: 2023-09-09 | Disposition: A | Discharge status: Home / Self Care | Payer: BC Managed Care – PPO | Attending: Obstetrics & Gynecology | Admitting: Obstetrics & Gynecology

## 2023-09-09 DIAGNOSIS — Z8049 Family history of malignant neoplasm of other genital organs: Secondary | ICD-10-CM | POA: Insufficient documentation

## 2023-09-09 DIAGNOSIS — R569 Unspecified convulsions: Secondary | ICD-10-CM | POA: Insufficient documentation

## 2023-09-09 DIAGNOSIS — D060 Carcinoma in situ of endocervix: Secondary | ICD-10-CM

## 2023-09-09 DIAGNOSIS — F17210 Nicotine dependence, cigarettes, uncomplicated: Secondary | ICD-10-CM | POA: Insufficient documentation

## 2023-09-09 DIAGNOSIS — N871 Moderate cervical dysplasia: Secondary | ICD-10-CM | POA: Diagnosis not present

## 2023-09-09 DIAGNOSIS — K862 Cyst of pancreas: Secondary | ICD-10-CM | POA: Diagnosis not present

## 2023-09-09 DIAGNOSIS — I10 Essential (primary) hypertension: Secondary | ICD-10-CM | POA: Diagnosis not present

## 2023-09-09 DIAGNOSIS — D069 Carcinoma in situ of cervix, unspecified: Secondary | ICD-10-CM

## 2023-09-09 DIAGNOSIS — Z803 Family history of malignant neoplasm of breast: Secondary | ICD-10-CM | POA: Insufficient documentation

## 2023-09-09 DIAGNOSIS — Z01818 Encounter for other preprocedural examination: Secondary | ICD-10-CM

## 2023-09-09 DIAGNOSIS — Z79624 Long term (current) use of inhibitors of nucleotide synthesis: Secondary | ICD-10-CM | POA: Insufficient documentation

## 2023-09-09 HISTORY — PX: LEEP: SHX91

## 2023-09-09 SURGERY — LEEP (LOOP ELECTROSURGICAL EXCISION PROCEDURE)
Anesthesia: General | Site: Vagina

## 2023-09-09 MED ORDER — CHLORHEXIDINE GLUCONATE 0.12 % MT SOLN
OROMUCOSAL | Status: AC
  Administered 2023-09-09: 15 mL via OROMUCOSAL
  Filled 2023-09-09: qty 15

## 2023-09-09 MED ORDER — LIDOCAINE HCL (PF) 2 % IJ SOLN
INTRAMUSCULAR | Status: DC | PRN
  Administered 2023-09-09: 50 mg via INTRADERMAL

## 2023-09-09 MED ORDER — PROPOFOL 10 MG/ML IV BOLUS
INTRAVENOUS | Status: AC
  Filled 2023-09-09: qty 20

## 2023-09-09 MED ORDER — ACETIC ACID 4% SOLUTION
Status: DC | PRN
  Administered 2023-09-09: 1 via TOPICAL

## 2023-09-09 MED ORDER — OXYCODONE HCL 5 MG PO TABS
5.0000 mg | ORAL_TABLET | Freq: Once | ORAL | Status: DC | PRN
Start: 2023-09-09 — End: 2023-09-09

## 2023-09-09 MED ORDER — LIDOCAINE-EPINEPHRINE 0.5 %-1:200000 IJ SOLN
INTRAMUSCULAR | Status: DC | PRN
  Administered 2023-09-09: 20 mL

## 2023-09-09 MED ORDER — LIDOCAINE-EPINEPHRINE 0.5 %-1:200000 IJ SOLN
INTRAMUSCULAR | Status: AC
Start: 2023-09-09 — End: ?
  Filled 2023-09-09: qty 50

## 2023-09-09 MED ORDER — FENTANYL CITRATE (PF) 100 MCG/2ML IJ SOLN
INTRAMUSCULAR | Status: DC | PRN
  Administered 2023-09-09 (×2): 50 ug via INTRAVENOUS

## 2023-09-09 MED ORDER — PROPOFOL 10 MG/ML IV BOLUS
INTRAVENOUS | Status: DC | PRN
  Administered 2023-09-09 (×3): 50 mg via INTRAVENOUS

## 2023-09-09 MED ORDER — FENTANYL CITRATE (PF) 100 MCG/2ML IJ SOLN
INTRAMUSCULAR | Status: AC
  Filled 2023-09-09: qty 2

## 2023-09-09 MED ORDER — ONDANSETRON HCL 4 MG/2ML IJ SOLN
INTRAMUSCULAR | Status: DC | PRN
  Administered 2023-09-09: 4 mg via INTRAVENOUS

## 2023-09-09 MED ORDER — LIDOCAINE HCL (PF) 2 % IJ SOLN
INTRAMUSCULAR | Status: AC
  Filled 2023-09-09: qty 5

## 2023-09-09 MED ORDER — OXYCODONE HCL 5 MG/5ML PO SOLN: 5.0000 mg | Freq: Once | ORAL | Status: DC | PRN

## 2023-09-09 MED ORDER — MONSELS FERRIC SUBSULFATE EX SOLN
CUTANEOUS | Status: DC | PRN
  Administered 2023-09-09: 1 via TOPICAL

## 2023-09-09 MED ORDER — ONDANSETRON HCL 4 MG/2ML IJ SOLN: 4.0000 mg | Freq: Once | INTRAMUSCULAR | Status: DC | PRN

## 2023-09-09 MED ORDER — CHLORHEXIDINE GLUCONATE 0.12 % MT SOLN
15.0000 mL | Freq: Once | OROMUCOSAL | Status: AC
Start: 2023-09-09 — End: 2023-09-09

## 2023-09-09 MED ORDER — ONDANSETRON HCL 4 MG/2ML IJ SOLN
INTRAMUSCULAR | Status: AC
  Filled 2023-09-09: qty 2

## 2023-09-09 MED ORDER — MIDAZOLAM HCL 2 MG/2ML IJ SOLN
INTRAMUSCULAR | Status: AC
Start: 2023-09-09 — End: ?
  Filled 2023-09-09: qty 2

## 2023-09-09 MED ORDER — DEXMEDETOMIDINE HCL IN NACL 80 MCG/20ML IV SOLN
INTRAVENOUS | Status: DC | PRN
  Administered 2023-09-09: 8 ug via INTRAVENOUS

## 2023-09-09 MED ORDER — LACTATED RINGERS IV SOLN: INTRAVENOUS | Status: DC

## 2023-09-09 MED ORDER — GLYCOPYRROLATE PF 0.2 MG/ML IJ SOSY
PREFILLED_SYRINGE | INTRAMUSCULAR | Status: AC
  Filled 2023-09-09: qty 1

## 2023-09-09 MED ORDER — IBUPROFEN 600 MG PO TABS: 600.0000 mg | ORAL_TABLET | Freq: Four times a day (QID) | ORAL | 0 refills | Status: DC | PRN

## 2023-09-09 MED ORDER — DEXAMETHASONE SODIUM PHOSPHATE 10 MG/ML IJ SOLN
INTRAMUSCULAR | Status: DC | PRN
  Administered 2023-09-09: 5 mg via INTRAVENOUS

## 2023-09-09 MED ORDER — PROPOFOL 500 MG/50ML IV EMUL
INTRAVENOUS | Status: AC
  Filled 2023-09-09: qty 50

## 2023-09-09 MED ORDER — ACETAMINOPHEN 325 MG PO TABS
650.0000 mg | ORAL_TABLET | Freq: Four times a day (QID) | ORAL | Status: AC | PRN
Start: 2023-09-09 — End: ?

## 2023-09-09 MED ORDER — GLYCOPYRROLATE PF 0.2 MG/ML IJ SOSY
PREFILLED_SYRINGE | INTRAMUSCULAR | Status: DC | PRN
  Administered 2023-09-09: .2 mg via INTRAVENOUS

## 2023-09-09 MED ORDER — MONSELS FERRIC SUBSULFATE EX SOLN
CUTANEOUS | Status: AC
Start: 2023-09-09 — End: ?
  Filled 2023-09-09: qty 8

## 2023-09-09 MED ORDER — PROPOFOL 500 MG/50ML IV EMUL
INTRAVENOUS | Status: DC | PRN
  Administered 2023-09-09: 75 ug/kg/min via INTRAVENOUS

## 2023-09-09 MED ORDER — FENTANYL CITRATE PF 50 MCG/ML IJ SOSY: 25.0000 ug | PREFILLED_SYRINGE | INTRAMUSCULAR | Status: DC | PRN

## 2023-09-09 MED ORDER — MIDAZOLAM HCL 2 MG/2ML IJ SOLN
INTRAMUSCULAR | Status: DC | PRN
  Administered 2023-09-09: 2 mg via INTRAVENOUS

## 2023-09-09 MED ORDER — DEXAMETHASONE SODIUM PHOSPHATE 10 MG/ML IJ SOLN
INTRAMUSCULAR | Status: AC
Start: 2023-09-09 — End: ?
  Filled 2023-09-09: qty 1

## 2023-09-09 MED ORDER — PROPOFOL 10 MG/ML IV BOLUS
INTRAVENOUS | Status: AC
Start: 2023-09-09 — End: ?
  Filled 2023-09-09: qty 20

## 2023-09-09 MED ORDER — ORAL CARE MOUTH RINSE: 15.0000 mL | Freq: Once | OROMUCOSAL | Status: AC

## 2023-09-09 MED ORDER — 0.9 % SODIUM CHLORIDE (POUR BTL) OPTIME
TOPICAL | Status: DC | PRN
  Administered 2023-09-09: 1000 mL

## 2023-09-09 SURGICAL SUPPLY — 29 items
APPLICATOR COTTON TIP 6 STRL (MISCELLANEOUS) ×1 IMPLANT
APPLICATOR COTTON TIP 6IN STRL (MISCELLANEOUS) ×1
CLOTH BEACON ORANGE TIMEOUT ST (SAFETY) ×1 IMPLANT
COVER LIGHT HANDLE STERIS (MISCELLANEOUS) ×2 IMPLANT
ELECT BALL LEEP 5MM RED (ELECTRODE) ×1 IMPLANT
ELECT LOOP LEEP RND 20X12 WHT (CUTTING LOOP) ×1
ELECT REM PT RETURN 9FT ADLT (ELECTROSURGICAL) ×1
ELECTRODE LOOP LP RND 20X12WHT (CUTTING LOOP) IMPLANT
ELECTRODE REM PT RTRN 9FT ADLT (ELECTROSURGICAL) ×1 IMPLANT
GAUZE 4X4 16PLY ~~LOC~~+RFID DBL (SPONGE) ×2 IMPLANT
GLOVE BIO SURGEON STRL SZ 6.5 (GLOVE) ×1 IMPLANT
GLOVE BIO SURGEON STRL SZ7 (GLOVE) ×1 IMPLANT
GLOVE BIOGEL PI IND STRL 7.0 (GLOVE) ×3 IMPLANT
GLOVE SURG SS PI 7.0 STRL IVOR (GLOVE) IMPLANT
GOWN STRL REUS W/ TWL LRG LVL3 (GOWN DISPOSABLE) ×1 IMPLANT
GOWN STRL REUS W/TWL LRG LVL3 (GOWN DISPOSABLE) ×1 IMPLANT
KIT TURNOVER CYSTO (KITS) ×1 IMPLANT
NDL HYPO 18GX1.5 BLUNT FILL (NEEDLE) ×1 IMPLANT
NEEDLE HYPO 18GX1.5 BLUNT FILL (NEEDLE) ×1
NS IRRIG 1000ML POUR BTL (IV SOLUTION) ×1 IMPLANT
NS IRRIG 500ML POUR BTL (IV SOLUTION) ×1 IMPLANT
PACK PERI GYN (CUSTOM PROCEDURE TRAY) ×1 IMPLANT
PAD ARMBOARD 7.5X6 YLW CONV (MISCELLANEOUS) ×1 IMPLANT
PAD TELFA 3X4 1S STER (GAUZE/BANDAGES/DRESSINGS) ×1 IMPLANT
POSITIONER HEAD 8X9X4 ADT (SOFTGOODS) ×1 IMPLANT
SCOPETTES 8 STERILE (MISCELLANEOUS) ×1 IMPLANT
SET BASIN LINEN APH (SET/KITS/TRAYS/PACK) ×1 IMPLANT
SUT VIC AB 0 CT1 27XBRD ANBCTR (SUTURE) ×1 IMPLANT
SYR CONTROL 10ML LL (SYRINGE) ×1 IMPLANT

## 2023-09-09 NOTE — Discharge Instructions (Addendum)
HOME INSTRUCTIONS  Please note any unusual or excessive bleeding, pain, swelling. Mild dizziness or drowsiness are normal for about 24 hours after surgery.   Shower when comfortable  Restrictions: No driving for 24 hours or while taking pain medications.  Activity:  Nothing in vagina (no tampons, douching, or intercourse) x 4 weeks; no tub baths for 4 weeks Vaginal spotting is expected but if your bleeding is heavy, period like,  please call the office   Diet:  You may return to your regular diet.  Do not eat large meals.  Eat small frequent meals throughout the day.  Continue to drink a good amount of water at least 6-8 glasses of water per day, hydration is very important for the healing process.  Pain Management: Take over the counter tylenol or ibuprofen as needed for pain.  You can either take one or alternate between the two medications for pain management.  You may also use a heating pack as needed.    Alcohol -- Avoid for 24 hours and while taking pain medications.  Nausea: Take sips of ginger ale or soda  Fever -- Call physician if temperature over 101 degrees  Follow up:  If you do not already have a follow up appointment scheduled, please call the office at [phone].  If you experience fever (a temperature greater than 100.4), pain unrelieved by pain medication, shortness of breath, swelling of a single leg, or any other symptoms which are concerning to you please the office immediately.  

## 2023-09-09 NOTE — Anesthesia Preprocedure Evaluation (Signed)
Anesthesia Evaluation  Patient identified by MRN, date of birth, ID band Patient awake    Reviewed: Allergy & Precautions, H&P , NPO status , Patient's Chart, lab work & pertinent test results, reviewed documented beta blocker date and time   Airway Mallampati: II  TM Distance: >3 FB Neck ROM: full    Dental no notable dental hx.    Pulmonary neg pulmonary ROS, Current Smoker   Pulmonary exam normal breath sounds clear to auscultation       Cardiovascular Exercise Tolerance: Good hypertension, negative cardio ROS  Rhythm:regular Rate:Normal     Neuro/Psych Seizures -,   negative psych ROS   GI/Hepatic negative GI ROS, Neg liver ROS,,,  Endo/Other  negative endocrine ROS    Renal/GU negative Renal ROS  negative genitourinary   Musculoskeletal   Abdominal   Peds  Hematology negative hematology ROS (+)   Anesthesia Other Findings   Reproductive/Obstetrics negative OB ROS                             Anesthesia Physical Anesthesia Plan  ASA: 2  Anesthesia Plan: General   Post-op Pain Management:    Induction:   PONV Risk Score and Plan: Propofol infusion  Airway Management Planned:   Additional Equipment:   Intra-op Plan:   Post-operative Plan:   Informed Consent: I have reviewed the patients History and Physical, chart, labs and discussed the procedure including the risks, benefits and alternatives for the proposed anesthesia with the patient or authorized representative who has indicated his/her understanding and acceptance.     Dental Advisory Given  Plan Discussed with: CRNA  Anesthesia Plan Comments:        Anesthesia Quick Evaluation

## 2023-09-09 NOTE — Anesthesia Procedure Notes (Signed)
Date/Time: 09/09/2023 10:33 AM  Performed by: Julian Reil, CRNAPre-anesthesia Checklist: Patient identified, Emergency Drugs available, Suction available and Patient being monitored Patient Re-evaluated:Patient Re-evaluated prior to induction Oxygen Delivery Method: Simple face mask Induction Type: IV induction Placement Confirmation: positive ETCO2

## 2023-09-09 NOTE — Op Note (Signed)
OPERATIVE NOTE  PREOPERATIVE DIAGNOSIS:  1) high grade dysplasia POSTOPERATIVE DIAGNOSIS: same PROCEDURE PERFORMED: Colposcopy, LEEP SURGEON: Dr. Myna Hidalgo ANESTHESIA: General endotracheal.  ESTIMATED BLOOD LOSS: 5cc.  IV FLUIDS: 500cc of crystalloid.  SPECIMEN(S): 1) cervical biopsy 2) inner cervical margin biopsy COMPLICATIONS: None.  CONDITION: Stable.  FINDINGS: Acetowhite changes noted with polyp-like appearance of cervix at 12 o'clock.  No lesions noted.  ??Procedure: Informed consent was obtained from the patient prior to taking her to the operating room where anesthesia was found to be adequate. She was placed in dorsal lithotomy.  She was prepped and draped in normal sterile fashion.  A bivalved coated speculum was placed in the patient's vagina. A grounding pad placed on the patient. Acetic acid solution was applied to the cervix and areas of decreased uptake were noted around the transformation zone.   Local anesthesia was administered via an intracervical block using 20 ml of 0.5% Lidocaine with epinephrine. The suction was turned on and the Large 1X Fisher Cone Biopsy Excisor on 50 Watts of blended current was used to excise the area of decreased uptake and excise the entire transformation zone. Excellent hemostasis was achieved using roller ball coagulation set at 50 Watts coagulation current. Monsel's solution was then applied and the speculum was removed from the vagina. Specimens were sent to pathology.  Counts were correct and pt was taken to recovery room in stable condition.  Myna Hidalgo, DO Attending Obstetrician & Gynecologist, [facility] for [employer], [facility]

## 2023-09-09 NOTE — Transfer of Care (Signed)
Immediate Anesthesia Transfer of Care Note  Patient: Holly Joseph  Procedure(s) Performed: LOOP ELECTROSURGICAL EXCISION PROCEDURE (LEEP) (Vagina )  Patient Location: PACU  Anesthesia Type:General  Level of Consciousness: awake, alert , and oriented  Airway & Oxygen Therapy: Patient Spontanous Breathing  Post-op Assessment: Report given to RN and Post -op Vital signs reviewed and stable  Post vital signs: Reviewed and stable  Last Vitals:  Vitals Value Taken Time  BP 112/81   Temp 97.4 F   Pulse 67   Resp 20   SpO2 100     Last Pain:  Vitals:   09/09/23 0938  TempSrc: Oral  PainSc: 0-No pain         Complications: No notable events documented.

## 2023-09-10 LAB — SURGICAL PATHOLOGY

## 2023-09-11 DIAGNOSIS — K610 Anal abscess: Secondary | ICD-10-CM | POA: Diagnosis not present

## 2023-09-13 NOTE — Anesthesia Postprocedure Evaluation (Signed)
Anesthesia Post Note  Patient: Holly Joseph  Procedure(s) Performed: LOOP ELECTROSURGICAL EXCISION PROCEDURE (LEEP) (Vagina )  Patient location during evaluation: Phase II Anesthesia Type: General Level of consciousness: awake Pain management: pain level controlled Vital Signs Assessment: post-procedure vital signs reviewed and stable Respiratory status: spontaneous breathing and respiratory function stable Cardiovascular status: blood pressure returned to baseline and stable Postop Assessment: no headache and no apparent nausea or vomiting Anesthetic complications: no Comments: Late entry   No notable events documented.   Last Vitals:  Vitals:   09/09/23 1144 09/09/23 1150  BP: (!) 126/90 120/88  Pulse: (!) 54 72  Resp: 19 18  Temp:  36.4 C  SpO2: 100% 100%    Last Pain:  Vitals:   09/10/23 1420  TempSrc:   PainSc: 0-No pain                 Windell Norfolk

## 2023-09-14 ENCOUNTER — Other Ambulatory Visit: Payer: Self-pay | Admitting: *Deleted

## 2023-09-14 DIAGNOSIS — K8689 Other specified diseases of pancreas: Secondary | ICD-10-CM

## 2023-09-14 DIAGNOSIS — K862 Cyst of pancreas: Secondary | ICD-10-CM

## 2023-09-15 ENCOUNTER — Encounter (HOSPITAL_COMMUNITY): Payer: Self-pay | Admitting: Obstetrics & Gynecology

## 2023-09-18 ENCOUNTER — Encounter: Payer: Self-pay | Admitting: Obstetrics & Gynecology

## 2023-09-18 ENCOUNTER — Ambulatory Visit: Payer: BC Managed Care – PPO | Admitting: Obstetrics & Gynecology

## 2023-09-18 VITALS — BP 118/78 | HR 90 | Ht 64.0 in | Wt 136.6 lb

## 2023-09-18 DIAGNOSIS — R350 Frequency of micturition: Secondary | ICD-10-CM

## 2023-09-18 DIAGNOSIS — Z9889 Other specified postprocedural states: Secondary | ICD-10-CM

## 2023-09-18 NOTE — Progress Notes (Signed)
    PostOp Visit Note  Holly Joseph is a 37 y.o. G21P1102 female who presents for a postoperative visit. She is 1 week postop following a LEEP completed on 12/11   Today she notes that she is doing well- notes slight yellow discharge.  Her only other concern is urinary frequency- denies dysuria or hematuria.  Otherwise doing well and reports no acute complaints Denies fever or chills.  Tolerating gen diet.  +Flatus, Regular BMs.    Review of Systems Pertinent items are noted in HPI.    Objective:  BP 118/78 (BP Location: Right Arm, Patient Position: Sitting, Cuff Size: Normal)   Pulse 90   Ht 5\' 4"  (1.626 m)   Wt 136 lb 9.6 oz (62 kg)   LMP 09/02/2023   BMI 23.45 kg/m    Physical Examination:  GENERAL ASSESSMENT: well developed and well nourished SKIN: normal color, no lesions CHEST: normal air exchange, respiratory effort normal with no retractions HEART: regular rate and rhythm ABDOMEN: soft, non-distended, non-tender GU: healing appropriately, no irregular discharge or odor EXTREMITY: no edema, no calf tenderness PSYCH: mood appropriate, normal affect       Assessment:    Urinary frequency S/p LEEP   Plan:   -Reviewed pathology findings, and her margins negative -Plan for UA to rule out underlying infection- UA negative -Reviewed pelvic rest for a total of 4 weeks -Questions and concerns were addressed follow-up in 6 months for Pap  Myna Hidalgo, DO Attending Obstetrician & Gynecologist, [facility] for [employer], [facility]

## 2023-10-05 ENCOUNTER — Telehealth: Payer: Self-pay

## 2023-10-05 NOTE — Telephone Encounter (Signed)
 Message Received: Today Mansouraty, Aloha Raddle., MD  Anitra Odetta CROME, RN; Phebe Olam MALACHI Ezzard Sonny GORMAN, PA-C; Mansouraty, Aloha Raddle., MD Zamarian Scarano, I have reviewed the MRI imaging. Endoscopic upper EUS makes sense to further evaluate the findings. Looks like she had a history of previously significant pancreatitis previously so sequela of pancreatitis is possible but ruling out any other mass or lesion occult is needed. Recommend referring provider obtain a CA 19-9. Move forward with scheduling upper EUS next available with me. Thanks. GM       Previous Messages    ----- Message ----- From: Anitra Odetta CROME, RN Sent: 10/02/2023  10:06 AM EST To: Aloha Wilhelmenia Raddle., MD; Olam CHRISTELLA Phebe  These go to Dr Wilhelmenia to review. ----- Message ----- From: Phebe Olam CHRISTELLA Sent: 10/02/2023   9:55 AM EST To: Odetta CROME Anitra, RN  This patient has a EUS, can you please call her to schedule.

## 2023-10-06 ENCOUNTER — Other Ambulatory Visit: Payer: Self-pay

## 2023-10-06 DIAGNOSIS — K862 Cyst of pancreas: Secondary | ICD-10-CM

## 2023-10-06 DIAGNOSIS — K8689 Other specified diseases of pancreas: Secondary | ICD-10-CM

## 2023-10-08 ENCOUNTER — Other Ambulatory Visit: Payer: Self-pay

## 2023-10-08 DIAGNOSIS — K8689 Other specified diseases of pancreas: Secondary | ICD-10-CM

## 2023-10-08 DIAGNOSIS — K862 Cyst of pancreas: Secondary | ICD-10-CM

## 2023-10-08 NOTE — Telephone Encounter (Signed)
 EUS has been set up for 11/05/23 at 8 am with GM at [facility]   Left message on machine to call back

## 2023-10-09 NOTE — Telephone Encounter (Signed)
 EUS scheduled, pt instructed and medications reviewed.  Patient instructions mailed to home.  Patient to call with any questions or concerns.

## 2023-10-12 DIAGNOSIS — K8689 Other specified diseases of pancreas: Secondary | ICD-10-CM | POA: Diagnosis not present

## 2023-10-12 DIAGNOSIS — K862 Cyst of pancreas: Secondary | ICD-10-CM | POA: Diagnosis not present

## 2023-10-13 LAB — CANCER ANTIGEN 19-9: CA 19-9: 7 U/mL (ref 0–35)

## 2023-10-28 ENCOUNTER — Encounter (HOSPITAL_COMMUNITY): Payer: Self-pay | Admitting: Gastroenterology

## 2023-10-30 ENCOUNTER — Encounter: Payer: Self-pay | Admitting: Family Medicine

## 2023-10-30 NOTE — Telephone Encounter (Signed)
She can bring the form and drop it off

## 2023-11-04 ENCOUNTER — Telehealth: Payer: Self-pay | Admitting: Family Medicine

## 2023-11-04 NOTE — Anesthesia Preprocedure Evaluation (Addendum)
 Anesthesia Evaluation  Patient identified by MRN, date of birth, ID band Patient awake    Reviewed: Allergy & Precautions, NPO status , Patient's Chart, lab work & pertinent test results  History of Anesthesia Complications Negative for: history of anesthetic complications  Airway Mallampati: II  TM Distance: >3 FB Neck ROM: Full    Dental  (+) Missing, Chipped,    Pulmonary Current Smoker   Pulmonary exam normal        Cardiovascular negative cardio ROS Normal cardiovascular exam     Neuro/Psych negative neurological ROS  negative psych ROS   GI/Hepatic negative GI ROS,,,(+)     substance abuse  alcohol use and marijuana usePancreatitis   Endo/Other  negative endocrine ROS    Renal/GU negative Renal ROS  negative genitourinary   Musculoskeletal negative musculoskeletal ROS (+)    Abdominal   Peds  Hematology negative hematology ROS (+)   Anesthesia Other Findings Day of surgery medications reviewed with patient.  Reproductive/Obstetrics negative OB ROS                              Anesthesia Physical Anesthesia Plan  ASA: 2  Anesthesia Plan: MAC   Post-op Pain Management: Minimal or no pain anticipated   Induction:   PONV Risk Score and Plan: 1 and Treatment may vary due to age or medical condition and Propofol  infusion  Airway Management Planned: Natural Airway  Additional Equipment: None  Intra-op Plan:   Post-operative Plan:   Informed Consent: I have reviewed the patients History and Physical, chart, labs and discussed the procedure including the risks, benefits and alternatives for the proposed anesthesia with the patient or authorized representative who has indicated his/her understanding and acceptance.       Plan Discussed with: CRNA  Anesthesia Plan Comments:         Anesthesia Quick Evaluation

## 2023-11-04 NOTE — Telephone Encounter (Signed)
 Annual physical examination   Noted  Copied Sleeved  Original in PCP box Copy front desk folder

## 2023-11-05 ENCOUNTER — Other Ambulatory Visit: Payer: Self-pay | Admitting: Family Medicine

## 2023-11-05 ENCOUNTER — Ambulatory Visit (HOSPITAL_COMMUNITY): Payer: Self-pay | Admitting: Certified Registered"

## 2023-11-05 ENCOUNTER — Ambulatory Visit (HOSPITAL_COMMUNITY)
Admission: RE | Admit: 2023-11-05 | Discharge: 2023-11-05 | Disposition: A | Discharge status: Home / Self Care | Payer: BC Managed Care – PPO | Attending: Gastroenterology | Admitting: Gastroenterology

## 2023-11-05 ENCOUNTER — Encounter (HOSPITAL_COMMUNITY): Payer: Self-pay | Admitting: Gastroenterology

## 2023-11-05 ENCOUNTER — Encounter (HOSPITAL_COMMUNITY)
Admission: RE | Disposition: A | Discharge status: Home / Self Care | Payer: Self-pay | Source: Home / Self Care | Attending: Gastroenterology

## 2023-11-05 ENCOUNTER — Other Ambulatory Visit: Payer: Self-pay

## 2023-11-05 DIAGNOSIS — K861 Other chronic pancreatitis: Secondary | ICD-10-CM

## 2023-11-05 DIAGNOSIS — K862 Cyst of pancreas: Secondary | ICD-10-CM

## 2023-11-05 DIAGNOSIS — K222 Esophageal obstruction: Secondary | ICD-10-CM | POA: Diagnosis not present

## 2023-11-05 DIAGNOSIS — K2289 Other specified disease of esophagus: Secondary | ICD-10-CM | POA: Insufficient documentation

## 2023-11-05 DIAGNOSIS — K3189 Other diseases of stomach and duodenum: Secondary | ICD-10-CM | POA: Diagnosis not present

## 2023-11-05 DIAGNOSIS — K2970 Gastritis, unspecified, without bleeding: Secondary | ICD-10-CM

## 2023-11-05 DIAGNOSIS — K859 Acute pancreatitis without necrosis or infection, unspecified: Secondary | ICD-10-CM | POA: Diagnosis not present

## 2023-11-05 DIAGNOSIS — Z5986 Financial insecurity: Secondary | ICD-10-CM | POA: Insufficient documentation

## 2023-11-05 DIAGNOSIS — K449 Diaphragmatic hernia without obstruction or gangrene: Secondary | ICD-10-CM | POA: Diagnosis not present

## 2023-11-05 DIAGNOSIS — F17210 Nicotine dependence, cigarettes, uncomplicated: Secondary | ICD-10-CM | POA: Insufficient documentation

## 2023-11-05 DIAGNOSIS — K8689 Other specified diseases of pancreas: Secondary | ICD-10-CM

## 2023-11-05 DIAGNOSIS — R933 Abnormal findings on diagnostic imaging of other parts of digestive tract: Secondary | ICD-10-CM | POA: Diagnosis not present

## 2023-11-05 DIAGNOSIS — K2950 Unspecified chronic gastritis without bleeding: Secondary | ICD-10-CM

## 2023-11-05 DIAGNOSIS — I899 Noninfective disorder of lymphatic vessels and lymph nodes, unspecified: Secondary | ICD-10-CM | POA: Diagnosis not present

## 2023-11-05 DIAGNOSIS — B9681 Helicobacter pylori [H. pylori] as the cause of diseases classified elsewhere: Secondary | ICD-10-CM

## 2023-11-05 DIAGNOSIS — K8521 Alcohol induced acute pancreatitis with uninfected necrosis: Secondary | ICD-10-CM

## 2023-11-05 HISTORY — PX: BIOPSY: SHX5522

## 2023-11-05 HISTORY — PX: ESOPHAGOGASTRODUODENOSCOPY: SHX5428

## 2023-11-05 HISTORY — PX: EUS: SHX5427

## 2023-11-05 LAB — PREGNANCY, URINE: Preg Test, Ur: NEGATIVE

## 2023-11-05 SURGERY — UPPER ENDOSCOPIC ULTRASOUND (EUS) RADIAL
Anesthesia: Monitor Anesthesia Care

## 2023-11-05 MED ORDER — SODIUM CHLORIDE 0.9 % IV SOLN: INTRAVENOUS | Status: DC

## 2023-11-05 MED ORDER — PROPOFOL 500 MG/50ML IV EMUL
INTRAVENOUS | Status: DC | PRN
  Administered 2023-11-05: 100 ug/kg/min via INTRAVENOUS

## 2023-11-05 MED ORDER — GLYCOPYRROLATE 0.2 MG/ML IJ SOLN
INTRAMUSCULAR | Status: DC | PRN
  Administered 2023-11-05: .2 mg via INTRAVENOUS

## 2023-11-05 MED ORDER — LIDOCAINE 2% (20 MG/ML) 5 ML SYRINGE
INTRAMUSCULAR | Status: DC | PRN
  Administered 2023-11-05: 40 mg via INTRAVENOUS

## 2023-11-05 MED ORDER — DEXMEDETOMIDINE HCL IN NACL 200 MCG/50ML IV SOLN
INTRAVENOUS | Status: DC | PRN
  Administered 2023-11-05: 8 ug via INTRAVENOUS
  Administered 2023-11-05: 4 ug via INTRAVENOUS

## 2023-11-05 MED ORDER — PROPOFOL 10 MG/ML IV BOLUS
INTRAVENOUS | Status: DC | PRN
  Administered 2023-11-05: 80 mg via INTRAVENOUS
  Administered 2023-11-05 (×2): 20 mg via INTRAVENOUS
  Administered 2023-11-05: 30 mg via INTRAVENOUS

## 2023-11-05 NOTE — Transfer of Care (Signed)
 Immediate Anesthesia Transfer of Care Note  Patient: Holly Joseph  Procedure(s) Performed: UPPER ENDOSCOPIC ULTRASOUND (EUS) RADIAL BIOPSY ESOPHAGOGASTRODUODENOSCOPY (EGD)  Patient Location: PACU  Anesthesia Type:MAC  Level of Consciousness: awake, alert , oriented, and patient cooperative  Airway & Oxygen Therapy: Patient Spontanous Breathing and Patient connected to face mask oxygen  Post-op Assessment: Report given to RN and Post -op Vital signs reviewed and stable  Post vital signs: Reviewed and stable  Last Vitals:  Vitals Value Taken Time  BP 113/72 11/05/23 0840  Temp    Pulse 84 11/05/23 0841  Resp 24 11/05/23 0841  SpO2 99 % 11/05/23 0841  Vitals shown include unfiled device data.  Last Pain:  Vitals:   11/05/23 0633  TempSrc: Tympanic  PainSc: 0-No pain         Complications: No notable events documented.

## 2023-11-05 NOTE — Op Note (Signed)
 [facility] Patient Name: Holly Joseph Procedure Date: 11/05/2023 MRN: [id number] Attending MD: Aloha Finner , MD, [id number] Date of Birth: May 13, 1986 CSN: [id number] Age: 38 Admit Type: Outpatient Procedure:                Upper EUS Indications:              Dilated pancreatic duct on MRCP, Suspected chronic                            pancreatitis, Exclusion of chronic pancreatitis,                            Acute pancreatitis Providers:                Aloha Finner, MD, Almarie Pizza, RN, Corene Southgate, Technician Referring MD:              Medicines:                Monitored Anesthesia Care Complications:            No immediate complications. Estimated Blood Loss:     Estimated blood loss was minimal. Procedure:                Pre-Anesthesia Assessment:                           - Prior to the procedure, a History and Physical                            was performed, and patient medications and                            allergies were reviewed. The patient's tolerance of                            previous anesthesia was also reviewed. The risks                            and benefits of the procedure and the sedation                            options and risks were discussed with the patient.                            All questions were answered, and informed consent                            was obtained. Prior Anticoagulants: The patient has                            taken no anticoagulant or antiplatelet agents                            except for NSAID medication.  ASA Grade Assessment:                            II - A patient with mild systemic disease. After                            reviewing the risks and benefits, the patient was                            deemed in satisfactory condition to undergo the                            procedure.                           After obtaining informed consent, the  endoscope was                            passed under direct vision. Throughout the                            procedure, the patient's blood pressure, pulse, and                            oxygen saturations were monitored continuously. The                            [id number]-[id number] ([id number]) Olympus endoscope was introduced                            through the mouth, and advanced to the second part                            of duodenum. The [id number]-[id number] ([id number]) Olympus                            duodenoscope was introduced through the mouth, and                            advanced to the area of papilla. The [id number]                            ([id number]) Olympus linear ultrasound scope was                            introduced through the mouth, and advanced to the                            duodenum for ultrasound examination from the                            stomach and duodenum. The upper EUS was  accomplished without difficulty. The patient                            tolerated the procedure. Scope In: Scope Out: Findings:      ENDOSCOPIC FINDING: :      No gross mucosal lesions were noted in the entire esophagus.      A non-obstructing Schatzki ring was found at the gastroesophageal       junction.      The Z-line was irregular and was found 36 cm from the incisors.      A 3 cm hiatal hernia was present.      Localized moderately erythematous mucosa without bleeding was found in       the cardia.      No other gross lesions were noted in the entire examined stomach.       Biopsies were taken with a cold forceps for histology and Helicobacter       pylori testing.      No gross lesions were noted in the duodenal bulb, in the first portion       of the duodenum and in the second portion of the duodenum.      The major papilla showed a wall intraduodenal portion with mild       congestion.      ENDOSONOGRAPHIC FINDING: :      Endosonographic imaging of the  pancreas showed sonographic changes       indicative of moderate-severe chronic pancreatitis in the entire       pancreas. The parenchyma had diffusely increased echogenicity,       hyperechoic foci with shadowing, lobularity without honeycombing and       hyperechoic strands. The pancreatic duct had hyperechoic walls, a       tortuous/ectatic appearance, intraductal calculi (noted within the       pancreas duct neck region (3 x 3 mm) and pancreas duct body region (9 x       9 mm)), dilated side-branches and duct dilation (PDH - 3.5 mm, PDN - 3.9       mm, PDB - 8.9 mm, PDT - 4 mm).      There was no sign of significant endosonographic abnormality in the       common bile duct (3.4 mm) and in the common hepatic duct (3.8 mm). An       unremarkable gallbladder was identified.      Endosonographic imaging of the ampulla showed no intramural       (subepithelial) lesion.      Endosonographic imaging in the visualized portion of the liver showed no       mass.      No malignant-appearing lymph nodes were visualized in the celiac region       (level 20), peripancreatic region and porta hepatis region.      The celiac region was visualized. Impression:               EGD impression:                           - No gross mucosal lesions in the entire esophagus.                            Non-obstructing Schatzki ring. Z-line irregular, 36  cm from the incisors.                           - 3 cm hiatal hernia.                           - Erythematous mucosa in the cardia. No other gross                            lesions in the entire stomach. Biopsied.                           - No gross lesions in the duodenal bulb, in the                            first portion of the duodenum and in the second                            portion of the duodenum.                           - Congested major papilla with long intraduodenal                            portion.                            EUS impression:                           - Endosonographic imaging of the pancreas showed                            sonographic changes consistent with moderate-severe                            chronic pancreatitis (with characterizations as                            noted above). Pancreas duct dilation noted in the                            region of the large 9 mm x 9 mm stone in the body                            of the pancreas duct.                           - There was no sign of significant pathology in the                            common bile duct and in the common hepatic duct.                            The gallbladder  was normal in appearance.                           - No malignant-appearing lymph nodes were                            visualized in the celiac region (level 20),                            peripancreatic region and porta hepatis region. Moderate Sedation:      Not Applicable - Patient had care per Anesthesia. Recommendation:           - The patient will be observed post-procedure,                            until all discharge criteria are met.                           - Discharge patient to home.                           - Patient has a contact number available for                            emergencies. The signs and symptoms of potential                            delayed complications were discussed with the                            patient. Return to normal activities tomorrow.                            Written discharge instructions were provided to the                            patient.                           - Low fat diet.                           - Observe patient's clinical course.                           - Await path results.                           - Patient should continue to have complete alcohol                            abstinence.                           - If patient begins to have episodes of recurrent  pancreatitis, without alcohol consumption,                            consideration of pancreatic ERCP could be had, but                            with the size of the large stone in the pancreatic                            neck and body regions, complete removal would be                            difficult in the setting of the size of the distal                            pancreas duct, but could be considered.                           - Repeat imaging with MRI/MRCP versus CT abdomen                            pancreas protocol in 1 year, if patient is doing                            well.                           - Consideration of PERT therapy, if patient begins                            to have episodes of pain and discomfort concerning                            for chronic pancreatitis progression.                           - The findings and recommendations were discussed                            with the patient.                           - The findings and recommendations were discussed                            with the patient's family. Procedure Code(s):        --- Professional ---                           [phone], Esophagogastroduodenoscopy, flexible,                            transoral; with endoscopic ultrasound examination  limited to the esophagus, stomach or duodenum, and                            adjacent structures                           43239, Esophagogastroduodenoscopy, flexible,                            transoral; with biopsy, single or multiple Diagnosis Code(s):        --- Professional ---                           K22.2, Esophageal obstruction                           K22.89, Other specified disease of esophagus                           K44.9, Diaphragmatic hernia without obstruction or                            gangrene                           K31.89, Other diseases of stomach and  duodenum                           I89.9, Noninfective disorder of lymphatic vessels                            and lymph nodes, unspecified                           K86.89, Other specified diseases of pancreas                           K85.90, Acute pancreatitis without necrosis or                            infection, unspecified                           R93.3, Abnormal findings on diagnostic imaging of                            other parts of digestive tract CPT copyright 2022 American [employer] Association. All rights reserved. The codes documented in this report are preliminary and upon coder review may  be revised to meet current compliance requirements. Aloha Finner, MD 11/05/2023 8:58:57 AM Number of Addenda: 0

## 2023-11-05 NOTE — Anesthesia Postprocedure Evaluation (Signed)
 Anesthesia Post Note  Patient: Holly Joseph  Procedure(s) Performed: UPPER ENDOSCOPIC ULTRASOUND (EUS) RADIAL BIOPSY ESOPHAGOGASTRODUODENOSCOPY (EGD)     Patient location during evaluation: PACU Anesthesia Type: MAC Level of consciousness: awake and alert Pain management: pain level controlled Vital Signs Assessment: post-procedure vital signs reviewed and stable Respiratory status: spontaneous breathing, nonlabored ventilation and respiratory function stable Cardiovascular status: blood pressure returned to baseline Postop Assessment: no apparent nausea or vomiting Anesthetic complications: no   No notable events documented.  Last Vitals:  Vitals:   11/05/23 0850 11/05/23 0900  BP: 123/75 (!) 127/98  Pulse: 69 65  Resp: 18 (!) 21  Temp:    SpO2: 99% 97%    Last Pain:  Vitals:   11/05/23 0840  TempSrc: Temporal  PainSc: Asleep                 Vertell Row

## 2023-11-05 NOTE — Discharge Instructions (Signed)

## 2023-11-05 NOTE — H&P (Signed)
 GASTROENTEROLOGY PROCEDURE H&P NOTE   Primary Care Physician: Terry Wilhelmena Lloyd Hilario, FNP  HPI: Holly Joseph is a 38 y.o. female who presents for EGD/EUS to evaluate dilated PD and history of prior pancreatitis.  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%   BV (bacterial vaginosis)    Eclamptic seizure    gestational   Pancreatitis    Screening for STD (sexually transmitted disease) 12/05/2013   Past Surgical History:  Procedure Laterality Date   LEEP N/A 09/09/2023   Procedure: LOOP ELECTROSURGICAL EXCISION PROCEDURE (LEEP);  Surgeon: Ozan, Jennifer, DO;  Location: [facility];  Service: Gynecology;  Laterality: N/A;   TUBAL LIGATION  06/28/2012   Procedure: POST PARTUM TUBAL LIGATION;  Surgeon: Gloris DELENA Hugger, MD;  Location: [facility];  Service: Gynecology;  Laterality: Bilateral;  Post partum tubal ligation with filshie clips   Current Facility-Administered Medications  Medication Dose Route Frequency Provider Last Rate Last Admin   0.9 %  sodium chloride  infusion   Intravenous Continuous Mansouraty, Aloha Raddle., MD        Current Facility-Administered Medications:    0.9 %  sodium chloride  infusion, , Intravenous, Continuous, Mansouraty, Aloha Raddle., MD Allergies  Allergen Reactions   Latex Swelling   Family History  Problem Relation Age of Onset   Stroke Mother    Diabetes Mother    Hypertension Mother    Cancer Mother        cervical    Lung cancer Mother        metastatic   Diabetes Sister    Breast cancer Maternal Aunt    Pancreatitis Cousin    Other Neg Hx    Colon cancer Neg Hx    Inflammatory bowel disease Neg Hx    Social History   Socioeconomic History   Marital status: Married    Spouse name: Not on file   Number of children: Not on file   Years of education: Not on file   Highest education level: Some  college, no degree  Occupational History   Not on file  Tobacco Use   Smoking status: Every Day    Current packs/day: 0.50    Average packs/day: 0.5 packs/day for 18.0 years (9.0 ttl pk-yrs)    Types: Cigarettes   Smokeless tobacco: Never  Vaping Use   Vaping status: Never Used  Substance and Sexual Activity   Alcohol use: Not Currently    Comment: no etoh   Drug use: Yes    Types: Marijuana    Comment: weekly   Sexual activity: Not Currently    Birth control/protection: Surgical    Comment: tubal  Other Topics Concern   Not on file  Social History Narrative   ** Merged History Encounter **       Social Drivers of Health   Financial Resource Strain: Medium Risk (08/13/2023)   Overall Financial Resource Strain (CARDIA)    Difficulty of Paying Living Expenses: Somewhat hard  Food Insecurity: Food Insecurity Present (08/17/2023)   Hunger Vital Sign    Worried About Running Out of Food in the Last Year: Sometimes true    Ran Out of Food in the Last Year: Sometimes true  Transportation Needs: No Transportation Needs (08/13/2023)   PRAPARE - Administrator, Civil Service (Medical): No    Lack of Transportation (  Non-Medical): No  Physical Activity: Insufficiently Active (08/13/2023)   Exercise Vital Sign    Days of Exercise per Week: 3 days    Minutes of Exercise per Session: 40 min  Stress: Stress Concern Present (08/13/2023)   [employer] of Occupational Health - Occupational Stress Questionnaire    Feeling of Stress : To some extent  Social Connections: Moderately Integrated (08/13/2023)   Social Connection and Isolation Panel [NHANES]    Frequency of Communication with Friends and Family: Twice a week    Frequency of Social Gatherings with Friends and Family: Twice a week    Attends Religious Services: 1 to 4 times per year    Active Member of Clubs or Organizations: Patient declined    Attends Banker Meetings: 1 to 4 times per year     Marital Status: Divorced  Intimate Partner Violence: Not At Risk (06/03/2023)   Humiliation, Afraid, Rape, and Kick questionnaire    Fear of Current or Ex-Partner: No    Emotionally Abused: No    Physically Abused: No    Sexually Abused: No    Physical Exam: Today's Vitals   10/28/23 1114 11/05/23 0633  BP:  98/64  Pulse:  (!) 58  Resp:  20  Temp:  97.9 F (36.6 C)  TempSrc:  Tympanic  SpO2:  100%  Weight: 62 kg 62 kg  Height:  5' 4 (1.626 m)  PainSc:  0-No pain   Body mass index is 23.46 kg/m. GEN: NAD EYE: Sclerae anicteric ENT: MMM CV: Non-tachycardic GI: Soft, NT/ND NEURO:  Alert & Oriented x 3  Lab Results: No results for input(s): WBC, HGB, HCT, PLT in the last 72 hours. BMET No results for input(s): NA, K, CL, CO2, GLUCOSE, BUN, CREATININE, CALCIUM in the last 72 hours. LFT No results for input(s): PROT, ALBUMIN, AST, ALT, ALKPHOS, BILITOT, BILIDIR, IBILI in the last 72 hours. PT/INR No results for input(s): LABPROT, INR in the last 72 hours.   Impression / Plan: This is a 38 y.o.female  who presents for EGD/EUS to evaluate dilated PD and history of prior pancreatitis.  The risks of an EUS including intestinal perforation, bleeding, infection, aspiration, and medication effects were discussed as was the possibility it may not give a definitive diagnosis if a biopsy is performed.  When a biopsy of the pancreas is done as part of the EUS, there is an additional risk of pancreatitis at the rate of about 1-2%.  It was explained that procedure related pancreatitis is typically mild, although it can be severe and even life threatening, which is why we do not perform random pancreatic biopsies and only biopsy a lesion/area we feel is concerning enough to warrant the risk.   The risks and benefits of endoscopic evaluation/treatment were discussed with the patient and/or family; these include but are not limited to the risk of  perforation, infection, bleeding, missed lesions, lack of diagnosis, severe illness requiring hospitalization, as well as anesthesia and sedation related illnesses.  The patient's history has been reviewed, patient examined, no change in status, and deemed stable for procedure.  The patient and/or family is agreeable to proceed.    Aloha Finner, MD [facility] Office # [phone]

## 2023-11-06 ENCOUNTER — Encounter: Payer: Self-pay | Admitting: Gastroenterology

## 2023-11-06 ENCOUNTER — Encounter (HOSPITAL_COMMUNITY): Payer: Self-pay | Admitting: Gastroenterology

## 2023-11-06 LAB — SURGICAL PATHOLOGY

## 2023-11-10 ENCOUNTER — Other Ambulatory Visit: Payer: Self-pay

## 2023-11-10 MED ORDER — BISMUTH/METRONIDAZ/TETRACYCLIN 140-125-125 MG PO CAPS: 3.0000 | ORAL_CAPSULE | Freq: Four times a day (QID) | ORAL | 0 refills | Status: DC

## 2023-11-10 NOTE — Progress Notes (Signed)
Sent patient a mychart message to get scheduled for follow up

## 2023-11-13 NOTE — Telephone Encounter (Signed)
Patient picked up forms.

## 2023-11-22 NOTE — Progress Notes (Unsigned)
 GI Office Note    Referring Provider: Wylene Men* Primary Care Physician:  Rica Records, FNP  Primary Gastroenterologist:  Chief Complaint   No chief complaint on file.   History of Present Illness   Holly Joseph is a 38 y.o. female presenting today for follow up. Last seen 07/2023. Seen at that time for history of hemorrhoids, s/p I&D of perianal abscess 11/1, possibility of anal fistula, followed by Dr. Earlie Counts ([facility]).   It was noted she had not completed MRI liver for liver lesions seen in 2021 while hospitalized for etoh pancreaittis/etoh hepatitis.    MRI liver with and without contrast 08/2023: IMPRESSION: 1. Previously seen sizable hyperenhancing liver lesions in the anterior left lobe of the liver, superior left lobe of the liver, and anterior right lobe of the liver are almost completely resolved and virtually indistinguishable on today's examination, as general matter appreciable only with the benefit of retrospective review of prior examination. There are faint residua of these lesions which are hypoenhancing on delayed hepatobiliary phase, in the anterior left lobe of the liver measuring 0.9 x 0.8 cm, and an irregular residua in the anterior right lobe of the liver measuring no greater than 0.7 cm. 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 2. No new liver lesions. 3. Fluid signal cystic lesion within the superior pancreatic body measuring 1.9 x 1.3 cm. Severe pancreatic ductal dilatation and cystic change within the pancreatic body and tail distal to this lesion, pancreatic duct measuring up to 0.9 cm in caliber. Findings are most consistent with chronic sequelae of pancreatitis seen acutely at the time of prior CT, however given patient age, size, complexity, and associated pancreatic  ductal dilatation, consider EUS/FNA for further assessment. No acute inflammatory findings at this time. 4. Hepatomegaly.        Recently completed EGD/EUS. Treated for h.pylori and will need to have eradication testing.      EUS 10/2023: EGD: -no gross mucosal lesion in entire esopagus. Non-obstructing schatzki ring. Z-line irregular, 26cm from incisors -3cm hh -erythematous mucosa in cardia. No other gross lesions in entire stomach. S/p bx. H.pylori gastritis***Talicia or Pyleroa -no gross lesions in duodenal bulb, first portion of duodenum, second portion of duodenum -congested major papilla with long intraduodenal portion EUS: -endosonographic imaging of pancreas showed sonograpic changes c/w moderate-severe chronic pancreatiits (with characterizations as noted above). Pancreas duct dilation noted in region of large 42mmX9mm stone in body of pancreas duct -there was no sign of significant pathology in CBD and in common hepatic duct. The gb appeared normal -no malignant-appearing lymph nodes were visualized in celiac region, peripancreatic region and porta hepatis region -if recurrent pancreatitis in setting of NO etoh consumption, consider pancreatic ERCP bu with size of large stone in pancreatic neck and body regions, complete removal would be difficult in settong of size of distal pancras duct, but could be considered -consider MRI/MRCP vs CT abd pancreas protocol in 1 year if patient is doing well -consideration of PERT therapy, if patient begins to have episodes of pain and discomfort concerning for chronic pancreatitis progression.  Today:  Needs to schedule colonoscopy.   Medications   Current Outpatient Medications  Medication Sig Dispense Refill   acetaminophen (TYLENOL) 325 MG tablet Take 2 tablets (650 mg total) by mouth every 6 (six) hours as needed.     Bismuth/Metronidaz/Tetracyclin (PYLERA) 140-125-125 MG CAPS  Take 3 capsules by mouth in the morning, at noon, in  the evening, and at bedtime for 10 days. 120 capsule 0   hydrocortisone (ANUSOL-HC) 25 MG suppository Place 1 suppository (25 mg total) rectally 2 (two) times daily. (Patient not taking: Reported on 08/25/2023) 12 suppository 2   ibuprofen (ADVIL) 600 MG tablet Take 1 tablet (600 mg total) by mouth every 6 (six) hours as needed. 30 tablet 0   valACYclovir (VALTREX) 1000 MG tablet Take 1 daily po for suppression 90 tablet 3   No current facility-administered medications for this visit.    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%   BV (bacterial vaginosis)    Eclamptic seizure    gestational   Pancreatitis    Screening for STD (sexually transmitted disease) 12/05/2013    Past Surgical History   Past Surgical History:  Procedure Laterality Date   BIOPSY  11/05/2023   Procedure: BIOPSY;  Surgeon: Lemar Lofty., MD;  Location: [facility];  Service: Gastroenterology;;   ESOPHAGOGASTRODUODENOSCOPY N/A 11/05/2023   Procedure: ESOPHAGOGASTRODUODENOSCOPY (EGD);  Surgeon: Lemar Lofty., MD;  Location: [facility];  Service: Gastroenterology;  Laterality: N/A;   EUS N/A 11/05/2023   Procedure: UPPER ENDOSCOPIC ULTRASOUND (EUS) RADIAL;  Surgeon: Lemar Lofty., MD;  Location: [facility];  Service: Gastroenterology;  Laterality: N/A;   LEEP N/A 09/09/2023   Procedure: LOOP ELECTROSURGICAL EXCISION PROCEDURE (LEEP);  Surgeon: Myna Hidalgo, DO;  Location: [facility];  Service: Gynecology;  Laterality: N/A;   TUBAL LIGATION  06/28/2012   Procedure: POST PARTUM TUBAL LIGATION;  Surgeon: Tereso Newcomer, MD;  Location: [facility];  Service: Gynecology;  Laterality: Bilateral;  Post partum  tubal ligation with filshie clips    Past Family History   Family History  Problem Relation Age of Onset   Stroke Mother    Diabetes Mother    Hypertension Mother    Cancer Mother        cervical    Lung cancer Mother        metastatic   Diabetes Sister    Breast cancer Maternal Aunt    Pancreatitis Cousin    Other Neg Hx    Colon cancer Neg Hx    Inflammatory bowel disease Neg Hx     Past Social History   Social History   Socioeconomic History   Marital status: Married    Spouse name: Not on file   Number of children: Not on file   Years of education: Not on file   Highest education level: Some college, no degree  Occupational History   Not on file  Tobacco Use   Smoking status: Every Day    Current packs/day: 0.50    Average packs/day: 0.5 packs/day for 18.0 years (9.0 ttl pk-yrs)    Types: Cigarettes   Smokeless tobacco: Never  Vaping Use   Vaping status: Never Used  Substance and Sexual Activity   Alcohol use: Not Currently    Comment: no etoh   Drug use: Yes    Types: Marijuana    Comment: weekly   Sexual activity: Not Currently    Birth control/protection: Surgical    Comment:  tubal  Other Topics Concern   Not on file  Social History Narrative   ** Merged History Encounter **       Social Drivers of Health   Financial Resource Strain: Medium Risk (08/13/2023)   Overall Financial Resource Strain (CARDIA)    Difficulty of Paying Living Expenses: Somewhat hard  Food Insecurity: Food Insecurity Present (08/17/2023)   Hunger Vital Sign    Worried About Running Out of Food in the Last Year: Sometimes true    Ran Out of Food in the Last Year: Sometimes true  Transportation Needs: No Transportation Needs (08/13/2023)   PRAPARE - Administrator, Civil Service (Medical): No    Lack of Transportation (Non-Medical): No  Physical Activity: Insufficiently Active (08/13/2023)   Exercise Vital Sign    Days of Exercise per Week: 3 days     Minutes of Exercise per Session: 40 min  Stress: Stress Concern Present (08/13/2023)   [employer] of Occupational Health - Occupational Stress Questionnaire    Feeling of Stress : To some extent  Social Connections: Moderately Integrated (08/13/2023)   Social Connection and Isolation Panel [NHANES]    Frequency of Communication with Friends and Family: Twice a week    Frequency of Social Gatherings with Friends and Family: Twice a week    Attends Religious Services: 1 to 4 times per year    Active Member of [employer] or Organizations: Patient declined    Attends Banker Meetings: 1 to 4 times per year    Marital Status: Divorced  Catering manager Violence: Not At Risk (06/03/2023)   Humiliation, Afraid, Rape, and Kick questionnaire    Fear of Current or Ex-Partner: No    Emotionally Abused: No    Physically Abused: No    Sexually Abused: No    Review of Systems   General: Negative for anorexia, weight loss, fever, chills, fatigue, weakness. ENT: Negative for hoarseness, difficulty swallowing , nasal congestion. CV: Negative for chest pain, angina, palpitations, dyspnea on exertion, peripheral edema.  Respiratory: Negative for dyspnea at rest, dyspnea on exertion, cough, sputum, wheezing.  GI: See history of present illness. GU:  Negative for dysuria, hematuria, urinary incontinence, urinary frequency, nocturnal urination.  Endo: Negative for unusual weight change.     Physical Exam   There were no vitals taken for this visit.   General: Well-nourished, well-developed in no acute distress.  Eyes: No icterus. Mouth: Oropharyngeal mucosa moist and pink , no lesions erythema or exudate. Lungs: Clear to auscultation bilaterally.  Heart: Regular rate and rhythm, no murmurs rubs or gallops.  Abdomen: Bowel sounds are normal, nontender, nondistended, no hepatosplenomegaly or masses,  no abdominal bruits or hernia , no rebound or guarding.  Rectal: ***  Extremities: No  lower extremity edema. No clubbing or deformities. Neuro: Alert and oriented x 4   Skin: Warm and dry, no jaundice.   Psych: Alert and cooperative, normal mood and affect.  Labs   Lab Results  Component Value Date   NA 140 06/03/2023   CL 103 06/03/2023   K 3.9 06/03/2023   CO2 17 (L) 06/03/2023   BUN 12 06/03/2023   CREATININE 0.82 06/03/2023   EGFR 96 06/03/2023   CALCIUM 9.5 06/03/2023   PHOS 1.2 (L) 12/28/2019   ALBUMIN 4.3 06/03/2023   GLUCOSE 89 06/03/2023   Lab Results  Component Value Date   ALT 16 06/03/2023   AST 19 06/03/2023   ALKPHOS 61 06/03/2023   BILITOT 0.6  06/03/2023   Lab Results  Component Value Date   WBC 5.4 07/19/2023   HGB 13.9 07/19/2023   HCT 42.3 07/19/2023   MCV 90.4 07/19/2023   PLT 239 07/19/2023   Lab Results  Component Value Date   LIPASE 50 12/23/2021   CA 19-9 7 on 09/2023  Imaging Studies   No results found.  Assessment       PLAN   ***   Leanna Battles. Melvyn Neth, MHS, PA-C [facility]

## 2023-11-23 ENCOUNTER — Ambulatory Visit (INDEPENDENT_AMBULATORY_CARE_PROVIDER_SITE_OTHER): Payer: BC Managed Care – PPO | Admitting: Gastroenterology

## 2023-11-23 ENCOUNTER — Encounter: Payer: Self-pay | Admitting: Gastroenterology

## 2023-11-23 VITALS — BP 120/71 | HR 73 | Temp 98.3°F | Ht 64.0 in | Wt 140.8 lb

## 2023-11-23 DIAGNOSIS — K861 Other chronic pancreatitis: Secondary | ICD-10-CM | POA: Insufficient documentation

## 2023-11-23 DIAGNOSIS — K8689 Other specified diseases of pancreas: Secondary | ICD-10-CM | POA: Insufficient documentation

## 2023-11-23 DIAGNOSIS — B9681 Helicobacter pylori [H. pylori] as the cause of diseases classified elsewhere: Secondary | ICD-10-CM | POA: Diagnosis not present

## 2023-11-23 DIAGNOSIS — K2970 Gastritis, unspecified, without bleeding: Secondary | ICD-10-CM

## 2023-11-23 DIAGNOSIS — K625 Hemorrhage of anus and rectum: Secondary | ICD-10-CM | POA: Insufficient documentation

## 2023-11-23 DIAGNOSIS — K860 Alcohol-induced chronic pancreatitis: Secondary | ICD-10-CM

## 2023-11-23 NOTE — Patient Instructions (Signed)
 Please let me know if you have more than 1-2 days of Pylera left.  Otherwise, complete h.pylori stool test four weeks after finishing your Pylera.  Colonoscopy to be schedule. Separate instructions to be provided.  We will update MRI of your pancreas in one year. Let us know if you have recurrent abdominal pain, weight loss, or diarrhea. Continue to refrain from all alcohol use.

## 2023-11-25 ENCOUNTER — Encounter: Payer: Self-pay | Admitting: Gastroenterology

## 2023-11-27 ENCOUNTER — Telehealth: Payer: Self-pay | Admitting: *Deleted

## 2023-11-27 NOTE — Telephone Encounter (Signed)
 LMOVM to call back to schedule TCS with Dr. Jena Gauss, ASA 2 fro rectal bleeding

## 2023-12-01 ENCOUNTER — Other Ambulatory Visit: Payer: Self-pay | Admitting: *Deleted

## 2023-12-01 ENCOUNTER — Encounter: Payer: Self-pay | Admitting: *Deleted

## 2023-12-01 DIAGNOSIS — K625 Hemorrhage of anus and rectum: Secondary | ICD-10-CM

## 2023-12-01 MED ORDER — PEG 3350-KCL-NA BICARB-NACL 420 G PO SOLR: 4000.0000 mL | Freq: Once | ORAL | 0 refills | Status: AC

## 2023-12-01 NOTE — Telephone Encounter (Signed)
 LMOVM to call back. Letter mailed. ?

## 2023-12-01 NOTE — Addendum Note (Signed)
 Addended by: Elinor Dodge on: 12/01/2023 11:26 AM   Modules accepted: Orders

## 2023-12-28 ENCOUNTER — Ambulatory Visit (HOSPITAL_COMMUNITY)
Admission: RE | Admit: 2023-12-28 | Discharge: 2023-12-28 | Disposition: A | Discharge status: Home / Self Care | Attending: Internal Medicine | Admitting: Internal Medicine

## 2023-12-28 ENCOUNTER — Encounter (HOSPITAL_COMMUNITY)
Admission: RE | Disposition: A | Discharge status: Home / Self Care | Payer: Self-pay | Source: Home / Self Care | Attending: Internal Medicine

## 2023-12-28 ENCOUNTER — Encounter (HOSPITAL_COMMUNITY): Payer: Self-pay | Admitting: Internal Medicine

## 2023-12-28 ENCOUNTER — Other Ambulatory Visit: Payer: Self-pay

## 2023-12-28 ENCOUNTER — Ambulatory Visit (HOSPITAL_COMMUNITY): Admitting: Certified Registered"

## 2023-12-28 DIAGNOSIS — K5730 Diverticulosis of large intestine without perforation or abscess without bleeding: Secondary | ICD-10-CM | POA: Diagnosis not present

## 2023-12-28 DIAGNOSIS — F17210 Nicotine dependence, cigarettes, uncomplicated: Secondary | ICD-10-CM | POA: Insufficient documentation

## 2023-12-28 DIAGNOSIS — Z5986 Financial insecurity: Secondary | ICD-10-CM | POA: Diagnosis not present

## 2023-12-28 DIAGNOSIS — K921 Melena: Secondary | ICD-10-CM | POA: Insufficient documentation

## 2023-12-28 DIAGNOSIS — D125 Benign neoplasm of sigmoid colon: Secondary | ICD-10-CM | POA: Insufficient documentation

## 2023-12-28 HISTORY — PX: POLYPECTOMY: SHX5525

## 2023-12-28 HISTORY — PX: COLONOSCOPY: SHX5424

## 2023-12-28 LAB — POCT PREGNANCY, URINE: Preg Test, Ur: NEGATIVE

## 2023-12-28 SURGERY — COLONOSCOPY
Anesthesia: General

## 2023-12-28 MED ORDER — LIDOCAINE HCL (PF) 2 % IJ SOLN
INTRAMUSCULAR | Status: DC | PRN
  Administered 2023-12-28: 60 mg via INTRADERMAL

## 2023-12-28 MED ORDER — PROPOFOL 1000 MG/100ML IV EMUL
INTRAVENOUS | Status: AC
  Filled 2023-12-28: qty 100

## 2023-12-28 MED ORDER — PHENYLEPHRINE 80 MCG/ML (10ML) SYRINGE FOR IV PUSH (FOR BLOOD PRESSURE SUPPORT)
PREFILLED_SYRINGE | INTRAVENOUS | Status: DC | PRN
Start: 2023-12-28 — End: 2023-12-28
  Administered 2023-12-28: 160 ug via INTRAVENOUS

## 2023-12-28 MED ORDER — LIDOCAINE HCL (PF) 2 % IJ SOLN
INTRAMUSCULAR | Status: AC
  Filled 2023-12-28: qty 5

## 2023-12-28 MED ORDER — PROPOFOL 10 MG/ML IV BOLUS
INTRAVENOUS | Status: DC | PRN
  Administered 2023-12-28 (×2): 30 mg via INTRAVENOUS
  Administered 2023-12-28: 70 mg via INTRAVENOUS

## 2023-12-28 MED ORDER — PROPOFOL 500 MG/50ML IV EMUL
INTRAVENOUS | Status: DC | PRN
  Administered 2023-12-28: 150 ug/kg/min via INTRAVENOUS

## 2023-12-28 MED ORDER — LACTATED RINGERS IV SOLN: INTRAVENOUS | Status: DC | PRN

## 2023-12-28 NOTE — Anesthesia Preprocedure Evaluation (Signed)
 Anesthesia Evaluation  Patient identified by MRN, date of birth, ID band Patient awake    Reviewed: Allergy & Precautions, NPO status , Patient's Chart, lab work & pertinent test results  History of Anesthesia Complications Negative for: history of anesthetic complications  Airway Mallampati: II  TM Distance: >3 FB Neck ROM: Full    Dental  (+) Missing, Chipped,    Pulmonary Current Smoker   Pulmonary exam normal        Cardiovascular negative cardio ROS Normal cardiovascular exam     Neuro/Psych  negative psych ROS   GI/Hepatic negative GI ROS,,,(+)     substance abuse  alcohol use and marijuana usePancreatitis   Endo/Other  negative endocrine ROS    Renal/GU negative Renal ROS  negative genitourinary   Musculoskeletal negative musculoskeletal ROS (+)    Abdominal   Peds  Hematology negative hematology ROS (+)   Anesthesia Other Findings Day of surgery medications reviewed with patient.  Reproductive/Obstetrics negative OB ROS                             Anesthesia Physical Anesthesia Plan  ASA: 2  Anesthesia Plan: General   Post-op Pain Management: Minimal or no pain anticipated   Induction:   PONV Risk Score and Plan: 1 and Treatment may vary due to age or medical condition and Propofol infusion  Airway Management Planned: Natural Airway and Nasal Cannula  Additional Equipment: None  Intra-op Plan:   Post-operative Plan:   Informed Consent: I have reviewed the patients History and Physical, chart, labs and discussed the procedure including the risks, benefits and alternatives for the proposed anesthesia with the patient or authorized representative who has indicated his/her understanding and acceptance.       Plan Discussed with: CRNA  Anesthesia Plan Comments:        Anesthesia Quick Evaluation

## 2023-12-28 NOTE — Discharge Instructions (Addendum)
  Colonoscopy Discharge Instructions  Read the instructions outlined below and refer to this sheet in the next few weeks. These discharge instructions provide you with general information on caring for yourself after you leave the hospital. Your doctor may also give you specific instructions. While your treatment has been planned according to the most current medical practices available, unavoidable complications occasionally occur. If you have any problems or questions after discharge, call Dr. Jena Gauss at [phone]. ACTIVITY You may resume your regular activity, but move at a slower pace for the next 24 hours.  Take frequent rest periods for the next 24 hours.  Walking will help get rid of the air and reduce the bloated feeling in your belly (abdomen).  No driving for 24 hours (because of the medicine (anesthesia) used during the test).   Do not sign any important legal documents or operate any machinery for 24 hours (because of the anesthesia used during the test).  NUTRITION Drink plenty of fluids.  You may resume your normal diet as instructed by your doctor.  Begin with a light meal and progress to your normal diet. Heavy or fried foods are harder to digest and may make you feel sick to your stomach (nauseated).  Avoid alcoholic beverages for 24 hours or as instructed.  MEDICATIONS You may resume your normal medications unless your doctor tells you otherwise.  WHAT YOU CAN EXPECT TODAY Some feelings of bloating in the abdomen.  Passage of more gas than usual.  Spotting of blood in your stool or on the toilet paper.  IF YOU HAD POLYPS REMOVED DURING THE COLONOSCOPY: No aspirin products for 7 days or as instructed.  No alcohol for 7 days or as instructed.  Eat a soft diet for the next 24 hours.  FINDING OUT THE RESULTS OF YOUR TEST Not all test results are available during your visit. If your test results are not back during the visit, make an appointment with your caregiver to find out the  results. Do not assume everything is normal if you have not heard from your caregiver or the medical facility. It is important for you to follow up on all of your test results.  SEEK IMMEDIATE MEDICAL ATTENTION IF: You have more than a spotting of blood in your stool.  Your belly is swollen (abdominal distention).  You are nauseated or vomiting.  You have a temperature over 101.  You have abdominal pain or discomfort that is severe or gets worse throughout the day.    1 polyp found and removed.  Colon diverticulosis and polyp information provided  Further recommendations to follow pending review of pathology report  Office visit with Tana Coast in 4 months  OFFICE TO CALL WITH APPOINTMENT

## 2023-12-28 NOTE — Op Note (Signed)
 [facility] Patient Name: Holly Joseph Procedure Date: 12/28/2023 7:10 AM MRN: [id number] Date of Birth: 1986-04-23 Attending MD: Gennette Pac , MD, [id number] CSN: [id number] Age: 38 Admit Type: Outpatient Procedure:                Colonoscopy Indications:              Hematochezia Providers:                Gennette Pac, MD, Crystal Page, Judeth Cornfield.                            Jessee Avers, Technician Referring MD:              Medicines:                Propofol per Anesthesia Complications:            No immediate complications. Estimated Blood Loss:     Estimated blood loss was minimal. Procedure:                Pre-Anesthesia Assessment:                           - Prior to the procedure, a History and Physical                            was performed, and patient medications and                            allergies were reviewed. The patient's tolerance of                            previous anesthesia was also reviewed. The risks                            and benefits of the procedure and the sedation                            options and risks were discussed with the patient.                            All questions were answered, and informed consent                            was obtained. Prior Anticoagulants: The patient has                            taken no anticoagulant or antiplatelet agents. ASA                            Grade Assessment: II - A patient with mild systemic                            disease. After reviewing the risks and benefits,  the patient was deemed in satisfactory condition to                            undergo the procedure.                           After obtaining informed consent, the colonoscope                            was passed under direct vision. Throughout the                            procedure, the patient's blood pressure, pulse, and                            oxygen saturations  were monitored continuously. The                            [id number]) scope was introduced through the                            anus and advanced to the the cecum, identified by                            appendiceal orifice and ileocecal valve. The                            colonoscopy was performed without difficulty. The                            patient tolerated the procedure well. The quality                            of the bowel preparation was adequate. The                            ileocecal valve, appendiceal orifice, and rectum                            were photographed. Scope In: 7:35:29 AM Scope Out: 7:47:48 AM Scope Withdrawal Time: 0 hours 6 minutes 26 seconds  Total Procedure Duration: 0 hours 12 minutes 19 seconds  Findings:      The perianal and digital rectal examinations were normal.      A 4 mm polyp was found in the mid sigmoid colon. The polyp was       semi-pedunculated. The polyp was removed with a cold snare. Resection       and retrieval were complete. Estimated blood loss was minimal.      Scattered small-mouthed diverticula were found in the ascending colon.      The exam was otherwise without abnormality on direct and retroflexion       views. Impression:               - One 4 mm polyp in the mid sigmoid colon, removed  with a cold snare. Resected and retrieved.                           - Diverticulosis in the ascending colon.                           - The examination was otherwise normal on direct                            and retroflexion views. Moderate Sedation:      Moderate (conscious) sedation was personally administered by an       anesthesia professional. The following parameters were monitored: oxygen       saturation, heart rate, blood pressure, respiratory rate, EKG, adequacy       of pulmonary ventilation, and response to care. Recommendation:           - Patient has a contact number available  for                            emergencies. The signs and symptoms of potential                            delayed complications were discussed with the                            patient. Return to normal activities tomorrow.                            Written discharge instructions were provided to the                            patient.                           - Advance diet as tolerated.                           - Continue present medications.                           - Repeat colonoscopy date to be determined after                            pending pathology results are reviewed for                            surveillance.                           - Return to GI office in 4 months. Procedure Code(s):        --- Professional ---                           [phone], Colonoscopy, flexible; with removal of  tumor(s), polyp(s), or other lesion(s) by snare                            technique Diagnosis Code(s):        --- Professional ---                           D12.5, Benign neoplasm of sigmoid colon                           K92.1, Melena (includes Hematochezia)                           K57.30, Diverticulosis of large intestine without                            perforation or abscess without bleeding CPT copyright 2022 American Medical Association. All rights reserved. The codes documented in this report are preliminary and upon coder review may  be revised to meet current compliance requirements. Gerrit Friends. Emmarose Klinke, MD Gennette Pac, MD 12/28/2023 7:58:02 AM This report has been signed electronically. Number of Addenda: 0

## 2023-12-28 NOTE — Transfer of Care (Addendum)
/  Immediate Anesthesia Transfer of Care Note  Patient: Holly Joseph  Procedure(s) Performed: COLONOSCOPY POLYPECTOMY  Patient Location: Endoscopy Unit  Anesthesia Type:General  Level of Consciousness: awake, drowsy, and patient cooperative  Airway & Oxygen Therapy: Patient Spontanous Breathing and Patient connected to nasal cannula oxygen  Post-op Assessment: Report given to RN and Post -op Vital signs reviewed and stable  Post vital signs: Reviewed and stable  Last Vitals:  Vitals Value Taken Time  BP 99/62 12/28/23   0753  Temp 36.6 12/28/23   0753  Pulse 52 12/28/23   0753  Resp 14 12/28/23   0753  SpO2 100% 12/28/23   0753    Last Pain:  Vitals:   12/28/23 0729  TempSrc:   PainSc: 0-No pain      Patients Stated Pain Goal: 8 (12/28/23 0703)  Complications: No notable events documented.

## 2023-12-28 NOTE — H&P (Signed)
 @LOGO @   Primary Care Physician:  Rica Records, FNP Primary Gastroenterologist:  Dr. Jena Gauss  Pre-Procedure History & Physical: HPI:  Holly Joseph is a 38 y.o. female here for diagnostic colonoscopy for intermittent rectal bleeding.  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%   BV (bacterial vaginosis)    Eclamptic seizure    gestational   Pancreatitis    Screening for STD (sexually transmitted disease) 12/05/2013    Past Surgical History:  Procedure Laterality Date   BIOPSY  11/05/2023   Procedure: BIOPSY;  Surgeon: Lemar Lofty., MD;  Location: [facility];  Service: Gastroenterology;;   ESOPHAGOGASTRODUODENOSCOPY N/A 11/05/2023   Procedure: ESOPHAGOGASTRODUODENOSCOPY (EGD);  Surgeon: Lemar Lofty., MD;  Location: [facility];  Service: Gastroenterology;  Laterality: N/A;   EUS N/A 11/05/2023   Procedure: UPPER ENDOSCOPIC ULTRASOUND (EUS) RADIAL;  Surgeon: Lemar Lofty., MD;  Location: [facility];  Service: Gastroenterology;  Laterality: N/A;   LEEP N/A 09/09/2023   Procedure: LOOP ELECTROSURGICAL EXCISION PROCEDURE (LEEP);  Surgeon: Myna Hidalgo, DO;  Location: [facility];  Service: Gynecology;  Laterality: N/A;   TUBAL LIGATION  06/28/2012   Procedure: POST PARTUM TUBAL LIGATION;  Surgeon: Tereso Newcomer, MD;  Location: [facility];  Service: Gynecology;  Laterality: Bilateral;  Post partum tubal ligation with filshie clips    Prior to Admission medications   Medication Sig Start Date End Date Taking? Authorizing Provider  acetaminophen (TYLENOL) 325 MG tablet Take 2 tablets (650 mg total) by mouth every 6 (six) hours as needed. 09/09/23  Yes Myna Hidalgo, DO  valACYclovir (VALTREX) 1000 MG tablet Take 1 daily po for suppression 07/20/23  Yes Adline Potter, NP  Bismuth/Metronidaz/Tetracyclin  (PYLERA) [phone] MG CAPS Take 3 capsules by mouth in the morning, at noon, in the evening, and at bedtime for 10 days. 11/10/23 11/23/23  Mansouraty, Netty Starring., MD    Allergies as of 12/01/2023 - Review Complete 11/23/2023  Allergen Reaction Noted   Latex Swelling 04/09/2013    Family History  Problem Relation Age of Onset   Stroke Mother    Diabetes Mother    Hypertension Mother    Cancer Mother        cervical    Lung cancer Mother        metastatic   Diabetes Sister    Breast cancer Maternal Aunt    Pancreatitis Cousin    Other Neg Hx    Colon cancer Neg Hx    Inflammatory bowel disease Neg Hx     Social History   Socioeconomic History   Marital status: Married    Spouse name: Not on file   Number of children: Not on file   Years of education: Not on file   Highest education level: Some college, no degree  Occupational History   Not on file  Tobacco Use   Smoking status: Every Day    Current packs/day: 0.50    Average packs/day: 0.5 packs/day for 18.0 years (9.0 ttl pk-yrs)    Types: Cigarettes   Smokeless tobacco: Never  Vaping Use   Vaping status: Never Used  Substance and Sexual Activity   Alcohol use: Not Currently    Comment: no etoh, quit two years ago (reported 10/2023)   Drug use: Yes    Types: Marijuana    Comment: daily  Sexual activity: Not Currently    Birth control/protection: Surgical    Comment: tubal  Other Topics Concern   Not on file  Social History Narrative   ** Merged History Encounter **       Social Drivers of Health   Financial Resource Strain: Medium Risk (08/13/2023)   Overall Financial Resource Strain (CARDIA)    Difficulty of Paying Living Expenses: Somewhat hard  Food Insecurity: Food Insecurity Present (08/17/2023)   Hunger Vital Sign    Worried About Running Out of Food in the Last Year: Sometimes true    Ran Out of Food in the Last Year: Sometimes true  Transportation Needs: No Transportation Needs (08/13/2023)    PRAPARE - Administrator, Civil Service (Medical): No    Lack of Transportation (Non-Medical): No  Physical Activity: Insufficiently Active (08/13/2023)   Exercise Vital Sign    Days of Exercise per Week: 3 days    Minutes of Exercise per Session: 40 min  Stress: Stress Concern Present (08/13/2023)   [employer] of Occupational Health - Occupational Stress Questionnaire    Feeling of Stress : To some extent  Social Connections: Moderately Integrated (08/13/2023)   Social Connection and Isolation Panel [NHANES]    Frequency of Communication with Friends and Family: Twice a week    Frequency of Social Gatherings with Friends and Family: Twice a week    Attends Religious Services: 1 to 4 times per year    Active Member of [employer] or Organizations: Patient declined    Attends Banker Meetings: 1 to 4 times per year    Marital Status: Divorced  Catering manager Violence: Not At Risk (06/03/2023)   Humiliation, Afraid, Rape, and Kick questionnaire    Fear of Current or Ex-Partner: No    Emotionally Abused: No    Physically Abused: No    Sexually Abused: No    Review of Systems: See HPI, otherwise negative ROS  Physical Exam: BP 111/81   Pulse (!) 56   Temp 98.3 F (36.8 C) (Oral)   Resp 14   Ht 5\' 3"  (1.6 m)   Wt 62.6 kg   LMP 12/17/2023   SpO2 100%   BMI 24.45 kg/m  General:   Alert,  Well-developed, well-nourished, pleasant and cooperative in NAD Skin:  Intact without significant lesions or rashes. Neck:  Supple; no masses or thyromegaly. No significant cervical adenopathy. Lungs:  Clear throughout to auscultation.   No wheezes, crackles, or rhonchi. No acute distress. Heart:  Regular rate and rhythm; no murmurs, clicks, rubs,  or gallops. Abdomen: Non-distended, normal bowel sounds.  Soft and nontender without appreciable mass or hepatosplenomegaly.   Impression/Plan: 38 year old lady with complicated GI history now here for diagnostic  colonoscopy given history of recent rectal bleeding.The risks, benefits, limitations, alternatives and imponderables have been reviewed with the patient. Questions have been answered. All parties are agreeable.       Notice: This dictation was prepared with Dragon dictation along with smaller phrase technology. Any transcriptional errors that result from this process are unintentional and may not be corrected upon review.

## 2023-12-28 NOTE — Anesthesia Postprocedure Evaluation (Signed)
 Anesthesia Post Note  Patient: Holly Joseph  Procedure(s) Performed: COLONOSCOPY POLYPECTOMY  Patient location during evaluation: PACU Anesthesia Type: General Level of consciousness: awake and alert Pain management: pain level controlled Vital Signs Assessment: post-procedure vital signs reviewed and stable Respiratory status: spontaneous breathing, nonlabored ventilation, respiratory function stable and patient connected to nasal cannula oxygen Cardiovascular status: blood pressure returned to baseline and stable Postop Assessment: no apparent nausea or vomiting Anesthetic complications: no   There were no known notable events for this encounter.   Last Vitals:  Vitals:   12/28/23 0703 12/28/23 0753  BP: 111/81 99/62  Pulse: (!) 56 (!) 53  Resp: 14 14  Temp: 36.8 C 36.6 C  SpO2: 100% 100%    Last Pain:  Vitals:   12/28/23 0753  TempSrc: Oral  PainSc: 0-No pain                 [name] L Donise Woodle

## 2023-12-29 ENCOUNTER — Encounter (HOSPITAL_COMMUNITY): Payer: Self-pay | Admitting: Internal Medicine

## 2023-12-29 LAB — SURGICAL PATHOLOGY

## 2024-06-06 ENCOUNTER — Ambulatory Visit: Admitting: Adult Health

## 2024-07-13 ENCOUNTER — Encounter: Payer: Self-pay | Admitting: Internal Medicine

## 2024-08-17 ENCOUNTER — Ambulatory Visit: Payer: BC Managed Care – PPO | Admitting: Family Medicine

## 2024-09-09 ENCOUNTER — Other Ambulatory Visit (HOSPITAL_BASED_OUTPATIENT_CLINIC_OR_DEPARTMENT_OTHER): Payer: Self-pay

## 2024-09-09 ENCOUNTER — Other Ambulatory Visit (HOSPITAL_COMMUNITY)
Admission: RE | Admit: 2024-09-09 | Discharge: 2024-09-09 | Disposition: A | Discharge status: Home / Self Care | Source: Ambulatory Visit | Attending: Obstetrics & Gynecology | Admitting: Obstetrics & Gynecology

## 2024-09-09 ENCOUNTER — Encounter: Payer: Self-pay | Admitting: Adult Health

## 2024-09-09 ENCOUNTER — Ambulatory Visit: Admitting: Adult Health

## 2024-09-09 VITALS — BP 133/89 | HR 81 | Ht 64.0 in | Wt 150.5 lb

## 2024-09-09 DIAGNOSIS — R87810 Cervical high risk human papillomavirus (HPV) DNA test positive: Secondary | ICD-10-CM | POA: Diagnosis not present

## 2024-09-09 DIAGNOSIS — F17200 Nicotine dependence, unspecified, uncomplicated: Secondary | ICD-10-CM | POA: Insufficient documentation

## 2024-09-09 DIAGNOSIS — Z720 Tobacco use: Secondary | ICD-10-CM | POA: Diagnosis not present

## 2024-09-09 DIAGNOSIS — Z01419 Encounter for gynecological examination (general) (routine) without abnormal findings: Secondary | ICD-10-CM

## 2024-09-09 DIAGNOSIS — Z9889 Other specified postprocedural states: Secondary | ICD-10-CM | POA: Insufficient documentation

## 2024-09-09 DIAGNOSIS — Z8742 Personal history of other diseases of the female genital tract: Secondary | ICD-10-CM

## 2024-09-09 DIAGNOSIS — D069 Carcinoma in situ of cervix, unspecified: Secondary | ICD-10-CM

## 2024-09-09 DIAGNOSIS — Z1331 Encounter for screening for depression: Secondary | ICD-10-CM | POA: Diagnosis not present

## 2024-09-09 DIAGNOSIS — Z8619 Personal history of other infectious and parasitic diseases: Secondary | ICD-10-CM | POA: Insufficient documentation

## 2024-09-09 DIAGNOSIS — Z87410 Personal history of cervical dysplasia: Secondary | ICD-10-CM | POA: Diagnosis not present

## 2024-09-09 MED ORDER — VALACYCLOVIR HCL 1 G PO TABS
1000.0000 mg | ORAL_TABLET | Freq: Every day | ORAL | 3 refills | Status: AC
  Filled 2024-09-09 – 2024-09-14 (×2): qty 30, 30d supply, fill #0
  Filled 2024-10-13: qty 30, 30d supply, fill #1

## 2024-09-09 NOTE — Progress Notes (Signed)
 Patient ID: Holly Joseph, female   DOB: [birth year], 38 y.o.   MRN: [id number] History of Present Illness: Holly Joseph is a 38 year old black female,divorced, G2P1102 in for a well woman gyn exam and pap. She missed 6 month pap after LEEP done 09/09/23 CIN 2-3, pap was ASCH +HPV 06/03/23. She requests rx for valtrex  to take daily.  PCP is Holly Joseph  Current Medications, Allergies, Past Medical History, Past Surgical History, Family History and Social History were reviewed in [employer] record.     Review of Systems: Patient denies any headaches, hearing loss, fatigue, blurred vision, shortness of breath, chest pain, abdominal pain, problems with bowel movements, urination, or intercourse.(Not active).  No joint pain or mood swings.     Physical Exam:BP 133/89 (BP Location: Right Arm, Patient Position: Sitting, Cuff Size: Normal)   Pulse 81   Ht 5' 4 (1.626 m)   Wt 150 lb 8 oz (68.3 kg)   LMP 09/04/2024 (Exact Date)   BMI 25.83 kg/m   General:  Well developed, well nourished, no acute distress Skin:  Warm and dry Neck:  Midline trachea, normal thyroid , good ROM, no lymphadenopathy Lungs; Clear to auscultation bilaterally Breast:  No dominant palpable mass, retraction, or nipple discharge Cardiovascular: Regular rate and rhythm Abdomen:  Soft, non tender, no hepatosplenomegaly Pelvic:  External genitalia is normal in appearance, no lesions.  The vagina is normal in appearance. Urethra has no lesions or masses. The cervix is bulbous, sp LEEP, pap with HR HPV genotyping performed, and GC/CHL.  Uterus is felt to be normal size, shape, and contour.  No adnexal masses or tenderness noted.Bladder is non tender, no masses felt. Extremities/musculoskeletal:  No swelling or varicosities noted, no clubbing or cyanosis Psych:  No mood changes, alert and cooperative,seems happy AA is 3 Fall risk Holly slow    09/09/2024    8:52 AM 08/17/2023    8:33 AM 06/03/2023    3:13 PM   Depression screen PHQ 2/9  Decreased Interest 1 2 0  Down, Depressed, Hopeless 1 2 0  PHQ - 2 Score 2 4 0  Altered sleeping 2 2 1   Tired, decreased energy 2 2 1   Change in appetite 1 2 0  Feeling bad or failure about yourself  0 1 0  Trouble concentrating 0 1 0  Moving slowly or fidgety/restless 0 0 0  Suicidal thoughts 0 0 0  PHQ-9 Score 7 12  2    Difficult doing work/chores  Somewhat difficult      Data saved with a previous flowsheet row definition   Denies any depression, just  work    09/09/2024    8:52 AM 08/17/2023    8:33 AM 06/03/2023    3:13 PM 03/26/2022   11:21 AM  GAD 7 : Generalized Anxiety Score  Nervous, Anxious, on Edge 1 1 1 1   Control/stop worrying 0 1 1 1   Worry too much - different things 0 3 1 1   Trouble relaxing 1  1 1   Restless 0 1 0 1  Easily annoyed or irritable 2 2 1 1   Afraid - awful might happen 0 1 0 0  Total GAD 7 Score 4  5 6   Anxiety Difficulty  Somewhat difficult        Upstream - 09/09/24 0848       Pregnancy Intention Screening   Does the patient want to become pregnant in the next year? No    Does the patient's partner  want to become pregnant in the next year? No    Would the patient like to discuss contraceptive options today? No      Contraception Wrap Up   Current Method Abstinence;Female Sterilization    End Method Abstinence;Female Sterilization    Contraception Counseling Provided No         Examination chaperoned by Clarita Salt LPN   Impression and plan: 1. Encounter for gynecological examination with Papanicolaou smear of cervix (Primary) Pap sent  Pap in 6 months  Physical and pap in 1 year Labs with PCP Mammogram at 42 Colonoscopy per GI  - Cytology - PAP( [place])  2. Cervical high risk HPV (human papillomavirus) test positive Pap sent  - Cytology - PAP( [place])  3. History of abnormal cervical Pap smear Pap sent - Cytology - PAP( [place])  4. S/P LEEP 09/09/23  5. High grade squamous  intraepithelial lesion (HGSIL), grade 3 CIN, on biopsy of cervix Pap sent Pap in 6 months   6. History of herpes simplex infection Refilled Valtrex  Meds ordered this encounter  Medications   valACYclovir  (VALTREX ) 1000 MG tablet    Sig: Take 1 tablet (1,000 mg total) by mouth daily.    Dispense:  90 tablet    Refill:  3    Supervising Provider:   JAYNE, LUTHER H [[id number]]     7. Smoker Try to cut down and quit

## 2024-09-13 ENCOUNTER — Ambulatory Visit: Payer: Self-pay | Admitting: Adult Health

## 2024-09-13 LAB — CYTOLOGY - PAP
Chlamydia: NEGATIVE
Comment: NEGATIVE
Comment: NEGATIVE
Comment: NORMAL
Diagnosis: NEGATIVE
High risk HPV: NEGATIVE
Neisseria Gonorrhea: NEGATIVE

## 2024-09-14 ENCOUNTER — Other Ambulatory Visit (HOSPITAL_BASED_OUTPATIENT_CLINIC_OR_DEPARTMENT_OTHER): Payer: Self-pay

## 2024-09-14 ENCOUNTER — Other Ambulatory Visit: Payer: Self-pay

## 2024-09-29 ENCOUNTER — Encounter: Payer: Self-pay | Admitting: Gastroenterology

## 2024-10-13 ENCOUNTER — Other Ambulatory Visit (HOSPITAL_COMMUNITY): Payer: Self-pay

## 2024-10-13 ENCOUNTER — Other Ambulatory Visit: Payer: Self-pay

## 2024-10-14 ENCOUNTER — Other Ambulatory Visit: Payer: Self-pay

## 2024-10-19 ENCOUNTER — Other Ambulatory Visit: Payer: Self-pay

## 2024-12-16 ENCOUNTER — Encounter: Admitting: Family Medicine
